# Patient Record
Sex: Female | Born: 1950
Health system: Southern US, Community
[De-identification: ages and names within clinical notes are randomized; demographics above are authoritative.]

## PROBLEM LIST (undated history)

## (undated) DIAGNOSIS — I10 Essential (primary) hypertension: Secondary | ICD-10-CM

## (undated) DIAGNOSIS — G629 Polyneuropathy, unspecified: Secondary | ICD-10-CM

## (undated) DIAGNOSIS — S92353A Displaced fracture of fifth metatarsal bone, unspecified foot, initial encounter for closed fracture: Secondary | ICD-10-CM

## (undated) DIAGNOSIS — F29 Unspecified psychosis not due to a substance or known physiological condition: Secondary | ICD-10-CM

## (undated) DIAGNOSIS — E119 Type 2 diabetes mellitus without complications: Secondary | ICD-10-CM

## (undated) DIAGNOSIS — E785 Hyperlipidemia, unspecified: Secondary | ICD-10-CM

## (undated) DIAGNOSIS — Z789 Other specified health status: Secondary | ICD-10-CM

## (undated) DIAGNOSIS — F209 Schizophrenia, unspecified: Secondary | ICD-10-CM

## (undated) HISTORY — DX: Hyperlipidemia, unspecified: E78.5

## (undated) HISTORY — PX: ABDOMINAL HYSTERECTOMY: SHX81

## (undated) HISTORY — PX: BLADDER SURGERY: SHX569

## (undated) HISTORY — PX: TUBAL LIGATION: SHX77

## (undated) HISTORY — DX: Unspecified psychosis not due to a substance or known physiological condition: F29

## (undated) HISTORY — DX: Essential (primary) hypertension: I10

## (undated) HISTORY — DX: Type 2 diabetes mellitus without complications: E11.9

---

## 2002-06-29 ENCOUNTER — Ambulatory Visit (HOSPITAL_COMMUNITY): Admission: RE | Admit: 2002-06-29 | Discharge: 2002-06-29 | Payer: Self-pay | Admitting: Internal Medicine

## 2002-06-29 ENCOUNTER — Encounter: Payer: Self-pay | Admitting: Internal Medicine

## 2002-07-31 ENCOUNTER — Other Ambulatory Visit: Admission: RE | Admit: 2002-07-31 | Discharge: 2002-07-31 | Payer: Self-pay | Admitting: Obstetrics & Gynecology

## 2004-02-13 ENCOUNTER — Emergency Department (HOSPITAL_COMMUNITY): Admission: EM | Admit: 2004-02-13 | Discharge: 2004-02-13 | Payer: Self-pay | Admitting: *Deleted

## 2005-03-06 ENCOUNTER — Ambulatory Visit (HOSPITAL_COMMUNITY): Admission: RE | Admit: 2005-03-06 | Discharge: 2005-03-06 | Payer: Self-pay | Admitting: Internal Medicine

## 2006-03-11 ENCOUNTER — Ambulatory Visit (HOSPITAL_COMMUNITY): Admission: RE | Admit: 2006-03-11 | Discharge: 2006-03-11 | Payer: Self-pay | Admitting: Internal Medicine

## 2007-03-14 ENCOUNTER — Ambulatory Visit (HOSPITAL_COMMUNITY): Admission: RE | Admit: 2007-03-14 | Discharge: 2007-03-14 | Payer: Self-pay | Admitting: Internal Medicine

## 2007-08-02 ENCOUNTER — Other Ambulatory Visit: Admission: RE | Admit: 2007-08-02 | Discharge: 2007-08-02 | Payer: Self-pay | Admitting: Obstetrics and Gynecology

## 2008-03-15 ENCOUNTER — Ambulatory Visit (HOSPITAL_COMMUNITY): Admission: RE | Admit: 2008-03-15 | Discharge: 2008-03-15 | Payer: Self-pay | Admitting: Internal Medicine

## 2008-04-30 ENCOUNTER — Other Ambulatory Visit: Payer: Self-pay

## 2008-05-01 ENCOUNTER — Ambulatory Visit: Payer: Self-pay | Admitting: Psychiatry

## 2008-05-01 ENCOUNTER — Inpatient Hospital Stay (HOSPITAL_COMMUNITY): Admission: EM | Admit: 2008-05-01 | Discharge: 2008-05-09 | Payer: Self-pay | Admitting: Psychiatry

## 2009-04-01 ENCOUNTER — Ambulatory Visit (HOSPITAL_COMMUNITY): Admission: RE | Admit: 2009-04-01 | Discharge: 2009-04-01 | Payer: Self-pay | Admitting: Internal Medicine

## 2010-05-13 ENCOUNTER — Ambulatory Visit (HOSPITAL_COMMUNITY): Admission: RE | Admit: 2010-05-13 | Discharge: 2010-05-13 | Payer: Self-pay | Admitting: Internal Medicine

## 2010-08-30 ENCOUNTER — Encounter: Payer: Self-pay | Admitting: Internal Medicine

## 2010-12-23 NOTE — H&P (Signed)
Regina Lambert, Regina Lambert NO.:  [id number]   MEDICAL RECORD NO.:  [id number]          PATIENT TYPE:  IPS   LOCATION:  0407                          FACILITY:  [facility]   PHYSICIAN:  Anselm Jungling, MD  DATE OF BIRTH:  24-Oct-1950   DATE OF ADMISSION:  05/01/2008  DATE OF DISCHARGE:                       PSYCHIATRIC ADMISSION ASSESSMENT   DATE OF ASSESSMENT:  May 01, 2008 at 13, 10.   IDENTIFYING INFORMATION:  This is a 60 year old African-American.  This  is a voluntary admission.   HISTORY OF PRESENT ILLNESS:  This patient presented at mental health  referred by her group home manager at [facility] after 2  weeks of gradually  decompensating.  Speech was pressured and rapid.  She was not making a lot of sense, becoming disheveled, speech pressured  and rapid, and she was complaining that people were out to get her and  that her psychiatrist had sent racist people to her home in order to  harm her.  She has voiced to her group home staff on the day of  admission that if she did not get out of there (referring to the group  home), she was not sure what she would do.  Her caregiver reports that  she has a long history of being very stable on her medications and on  Friday, April 27, 2008 had received her Haldol Decanoate dose  without problems.  She has been compliant with medications up until the  past couple of days.  An only known stressor is that her primary  caregiver at the group home died in 01/21/2023 of this year, and the patient  is grieving her death.   PAST PSYCHIATRIC HISTORY:  Followed by Dr. Jenne Campus at [facility] (formerly [facility]).  This is her first  [facility] admission.  She has a history of schizophrenia and has lived for  several years at [facility].  Has been stable on her current  Haldol dose.   SOCIAL HISTORY:  A single African-American female living in a group home  on disability for  mental illness.  She has voiced no suicidal thoughts.  No known history of suicide attempts.  No history of aggressive  behavior.  No known history of substance abuse.   MEDICAL HISTORY:  Primary care physician not clear.   MEDICAL PROBLEMS:  1. Hyponatremia.  2. Diabetes mellitus type 2.   CURRENT MEDICATIONS:  Current medications were validated with the group  home:  1. Xanax 0.25 mg p.o. b.i.d.  2. Haldol Decanoate 50 mg IM q. 3 weeks; last given April 27, 2008.  3. MiraLax 17 grams daily p.r.n. for constipation.  4. Metformin 500 mg b.i.d.   DRUG ALLERGIES:  PENICILLIN.   PHYSICAL EXAMINATION:  The physical exam was done in the emergency room,  along with review of systems, as noted in the records.  GENERAL:  This is a fully alert female who is in no acute distress on  admission to the unit.  VITAL SIGNS:  She is 5 feet 7 inches  tall, 118 pounds (baseline weight  unknown), temperature 97.8, pulse 106, respirations 20, blood pressure  139/91.   DIAGNOSTIC STUDIES:  Diagnostic studies were done in the emergency room.  CBC revealed a WBC of 11.5, hemoglobin 13.5, hematocrit 39.4 and  platelets 326,000.  Her MCV is 97.5.  Chemistry revealed sodium of 127,  potassium 3.5, chloride 94, carbon dioxide 22, BUN 7, creatinine 0.86  and random glucose 134, calcium 9.1.  Alcohol level less than 5.  Urine  drug screen was negative for benzodiazepines, and her routine urinalysis  was negative for nitrites and leukocyte esterase.  Also remarkable for  trace hemoglobin, negative for bilirubin, ketones, glucose and protein,  and no WBCs.   MENTAL STATUS EXAM:  This is a thin, disheveled, but pleasant African-  American female with mildly pressured speech who appears to be quite  disorganized in thinking, looking around, some difficulty attending to  conversation.  Appears to be internally distracted.  Thinking is  tangential.  Insight negligible.  Oriented to person.  Thinks  that she  is actually in [place] when she is asked about her location.  Understands that she is in the hospital, but not sure exactly where and  unclear about time frames.  Pleasant, directable, cooperative with staff  and peers.  Immediate memory is intact, but recent and remote are poor  secondary to internal distractions.   IMPRESSION:  AXIS I:  Psychosis NOS, rule out schizophrenia, rule out  delirium secondary to benzodiazepine withdrawal.  AXIS II:  Deferred.  AXIS III:  Diabetes mellitus type 2, hyponatremia NOS.  AXIS IV:  Rule out grief process secondary to death of a care giver in  02/04/2008.  AXIS V:  Current 34; past year not known.   PLAN:  The plan is to involuntarily admit.  This is an involuntary  admission to improve her reality testing, stabilize her mood.  We are  going to give her 1 mg of Ativan now and will continue her Xanax as  previously ordered and additionally give her 0.25 mg b.i.d. p.r.n. for  anxiety.  She is on Ambien 10 mg h.s. p.r.n. insomnia.  Was also started  on Haldol 5 mg p.o. b.i.d.  Will continue her Haldol Decanoate which is  not due for another 3 weeks, and will continue her other routine  medications and monitor CBGs.      Margaret A. Lorin Picket, N.P.      Anselm Jungling, MD  Electronically Signed    MAS/MEDQ  D:  05/01/2008  T:  05/02/2008  Job:  [id number]

## 2010-12-26 NOTE — Discharge Summary (Signed)
NAMEHANNELORE, BOVA NO.:  [id number]   MEDICAL RECORD NO.:  [id number]          PATIENT TYPE:  IPS   LOCATION:  0407                          FACILITY:  BH   PHYSICIAN:  Anselm Jungling, MD  DATE OF BIRTH:  11-13-1950   DATE OF ADMISSION:  05/01/2008  DATE OF DISCHARGE:  05/09/2008                               DISCHARGE SUMMARY   IDENTIFYING DATA AND REASON FOR ADMISSION:  This is an inpatient  psychiatric admission for Regina Lambert, a 60 year old single African American  female who presented in a state of mental decompensation in association  with her chronic schizophrenia.  Please refer to the admission note for  further details pertaining to the symptoms, circumstances and history  that led to her hospitalization.  She was given an initial Axis I  diagnosis of schizophrenia, chronic paranoid type.   MEDICAL AND LABORATORY:  She was medically and physically assessed by  the psychiatric nurse practitioner.  She came to [place] with a history of  diabetes mellitus.  She appeared thin and under nourished.  She was  continued on Glucophage 500 mg b.i.d.  There were no acute medical  issues.   HOSPITAL COURSE:  The patient was admitted to the inpatient psychiatric  service.  She presented as a thin under nourished woman who initially  was pleasant, but quite disorganized with her tangential thinking.  Her  insight was relatively poor.  However, she was comfortable with being in  our facility for treatment and was cooperative throughout.  She was a  good participant in the milieu.  By the second hospital day, she was  stating that all was well.  She was eating and sleeping well.  She was  agreeable and accepting of the Haldol regimen that had been started.  She was tolerating it well.  She continued on the inpatient service for  approximately 1-week.  Prior to discharge, she was agreeable to  institution of Haldol decanoate 50 mg IM.  In addition, Cogentin 0.5 mg  b.i.d. was given, as well as Xanax 0.5 mg b.i.d.  She agreed to  following aftercare plan.   AFTERCARE:  The patient was to followup at [facility] in  [place] with an appointment on May 11, 2008.   DISCHARGE MEDICATIONS:  1. Glucophage 500 mg b.i.d.  2. Cogentin 0.5 mg b.i.d.  3. Xanax 0.5 mg b.i.d.  4. Haldol 5 mg b.i.d.  5. Haldol decanoate 50 mg IM, next due May 18, 2008.  6. MiraLax 17 mg daily.   DISCHARGE DIAGNOSES:  Axis I:  Schizophrenia, chronic paranoid type,  acute exacerbation, but resolving.  Axis II:  Deferred.  Axis III:  Diabetes mellitus, non-insulin dependent.  Axis IV:  Stressors severe.  Axis V:  Global Assessment of Functioning on discharge 55.      Anselm Jungling, MD  Electronically Signed     SPB/MEDQ  D:  05/17/2008  T:  05/17/2008  Job:  [id number]

## 2011-05-06 ENCOUNTER — Other Ambulatory Visit (HOSPITAL_COMMUNITY): Payer: Self-pay | Admitting: Internal Medicine

## 2011-05-06 DIAGNOSIS — Z139 Encounter for screening, unspecified: Secondary | ICD-10-CM

## 2011-05-11 LAB — URINALYSIS, ROUTINE W REFLEX MICROSCOPIC
Bilirubin Urine: NEGATIVE
Glucose, UA: NEGATIVE
Ketones, ur: NEGATIVE
Specific Gravity, Urine: <1.005 — ABNORMAL LOW
pH: 5.5

## 2011-05-11 LAB — BASIC METABOLIC PANEL
BUN: 7
Chloride: 94 — ABNORMAL LOW
Creatinine, Ser: 0.86
GFR calc Af Amer: >60
GFR calc non Af Amer: >60
Potassium: 3.5

## 2011-05-11 LAB — COMPREHENSIVE METABOLIC PANEL
ALT: 18
AST: 24
Albumin: 3.7
Alkaline Phosphatase: 85
Calcium: 9.7
GFR calc Af Amer: >60
Potassium: 4.3
Sodium: 131 — ABNORMAL LOW
Total Protein: 6.4

## 2011-05-11 LAB — URINE MICROSCOPIC-ADD ON

## 2011-05-11 LAB — DIFFERENTIAL
Eosinophils Absolute: 0
Lymphocytes Relative: 22
Lymphs Abs: 2.5
Monocytes Relative: 8
Neutrophils Relative %: 70

## 2011-05-11 LAB — GLUCOSE, CAPILLARY
Glucose-Capillary: 121 — ABNORMAL HIGH
Glucose-Capillary: 121 — ABNORMAL HIGH
Glucose-Capillary: 141 — ABNORMAL HIGH
Glucose-Capillary: 144 — ABNORMAL HIGH
Glucose-Capillary: 144 — ABNORMAL HIGH
Glucose-Capillary: 149 — ABNORMAL HIGH
Glucose-Capillary: 156 — ABNORMAL HIGH
Glucose-Capillary: 184 — ABNORMAL HIGH
Glucose-Capillary: 224 — ABNORMAL HIGH

## 2011-05-11 LAB — CBC
HCT: 39.4
MCV: 97.5
Platelets: 326
RBC: 4.04
WBC: 11.5 — ABNORMAL HIGH

## 2011-05-11 LAB — RAPID URINE DRUG SCREEN, HOSP PERFORMED
Barbiturates: NOT DETECTED
Cocaine: NOT DETECTED
Opiates: NOT DETECTED

## 2011-05-11 LAB — ETHANOL: Alcohol, Ethyl (B): <5

## 2011-05-18 ENCOUNTER — Ambulatory Visit (HOSPITAL_COMMUNITY)
Admission: RE | Admit: 2011-05-18 | Discharge: 2011-05-18 | Disposition: A | Discharge status: Home / Self Care | Payer: Medicare Other | Source: Ambulatory Visit | Attending: Internal Medicine | Admitting: Internal Medicine

## 2011-05-18 DIAGNOSIS — Z139 Encounter for screening, unspecified: Secondary | ICD-10-CM

## 2011-05-18 DIAGNOSIS — Z1231 Encounter for screening mammogram for malignant neoplasm of breast: Secondary | ICD-10-CM | POA: Insufficient documentation

## 2011-09-23 DIAGNOSIS — F200 Paranoid schizophrenia: Secondary | ICD-10-CM | POA: Diagnosis not present

## 2011-10-13 DIAGNOSIS — E119 Type 2 diabetes mellitus without complications: Secondary | ICD-10-CM | POA: Diagnosis not present

## 2011-10-13 DIAGNOSIS — E1149 Type 2 diabetes mellitus with other diabetic neurological complication: Secondary | ICD-10-CM | POA: Diagnosis not present

## 2011-12-07 DIAGNOSIS — E119 Type 2 diabetes mellitus without complications: Secondary | ICD-10-CM | POA: Diagnosis not present

## 2011-12-22 DIAGNOSIS — E119 Type 2 diabetes mellitus without complications: Secondary | ICD-10-CM | POA: Diagnosis not present

## 2011-12-22 DIAGNOSIS — E1149 Type 2 diabetes mellitus with other diabetic neurological complication: Secondary | ICD-10-CM | POA: Diagnosis not present

## 2012-02-23 DIAGNOSIS — E119 Type 2 diabetes mellitus without complications: Secondary | ICD-10-CM | POA: Diagnosis not present

## 2012-02-23 DIAGNOSIS — E1149 Type 2 diabetes mellitus with other diabetic neurological complication: Secondary | ICD-10-CM | POA: Diagnosis not present

## 2012-03-07 DIAGNOSIS — E119 Type 2 diabetes mellitus without complications: Secondary | ICD-10-CM | POA: Diagnosis not present

## 2012-03-10 DIAGNOSIS — F39 Unspecified mood [affective] disorder: Secondary | ICD-10-CM | POA: Diagnosis not present

## 2012-04-13 ENCOUNTER — Other Ambulatory Visit (HOSPITAL_COMMUNITY): Payer: Self-pay | Admitting: Internal Medicine

## 2012-04-13 DIAGNOSIS — Z139 Encounter for screening, unspecified: Secondary | ICD-10-CM

## 2012-05-03 DIAGNOSIS — E1149 Type 2 diabetes mellitus with other diabetic neurological complication: Secondary | ICD-10-CM | POA: Diagnosis not present

## 2012-05-03 DIAGNOSIS — E119 Type 2 diabetes mellitus without complications: Secondary | ICD-10-CM | POA: Diagnosis not present

## 2012-05-20 ENCOUNTER — Ambulatory Visit (HOSPITAL_COMMUNITY)
Admission: RE | Admit: 2012-05-20 | Discharge: 2012-05-20 | Disposition: A | Discharge status: Home / Self Care | Payer: Medicare Other | Source: Ambulatory Visit | Attending: Internal Medicine | Admitting: Internal Medicine

## 2012-05-20 DIAGNOSIS — Z139 Encounter for screening, unspecified: Secondary | ICD-10-CM

## 2012-05-20 DIAGNOSIS — Z1231 Encounter for screening mammogram for malignant neoplasm of breast: Secondary | ICD-10-CM | POA: Insufficient documentation

## 2012-06-09 DIAGNOSIS — E119 Type 2 diabetes mellitus without complications: Secondary | ICD-10-CM | POA: Diagnosis not present

## 2012-06-09 DIAGNOSIS — Z23 Encounter for immunization: Secondary | ICD-10-CM | POA: Diagnosis not present

## 2012-07-12 DIAGNOSIS — E1149 Type 2 diabetes mellitus with other diabetic neurological complication: Secondary | ICD-10-CM | POA: Diagnosis not present

## 2012-07-12 DIAGNOSIS — E119 Type 2 diabetes mellitus without complications: Secondary | ICD-10-CM | POA: Diagnosis not present

## 2012-08-25 DIAGNOSIS — F39 Unspecified mood [affective] disorder: Secondary | ICD-10-CM | POA: Diagnosis not present

## 2012-09-08 DIAGNOSIS — F29 Unspecified psychosis not due to a substance or known physiological condition: Secondary | ICD-10-CM | POA: Diagnosis not present

## 2012-09-08 DIAGNOSIS — Z0000 Encounter for general adult medical examination without abnormal findings: Secondary | ICD-10-CM | POA: Diagnosis not present

## 2012-09-08 DIAGNOSIS — E119 Type 2 diabetes mellitus without complications: Secondary | ICD-10-CM | POA: Diagnosis not present

## 2012-09-20 DIAGNOSIS — E119 Type 2 diabetes mellitus without complications: Secondary | ICD-10-CM | POA: Diagnosis not present

## 2012-09-20 DIAGNOSIS — E1149 Type 2 diabetes mellitus with other diabetic neurological complication: Secondary | ICD-10-CM | POA: Diagnosis not present

## 2012-12-05 DIAGNOSIS — E119 Type 2 diabetes mellitus without complications: Secondary | ICD-10-CM | POA: Diagnosis not present

## 2012-12-05 DIAGNOSIS — J410 Simple chronic bronchitis: Secondary | ICD-10-CM | POA: Diagnosis not present

## 2012-12-08 DIAGNOSIS — F39 Unspecified mood [affective] disorder: Secondary | ICD-10-CM | POA: Diagnosis not present

## 2012-12-16 DIAGNOSIS — E119 Type 2 diabetes mellitus without complications: Secondary | ICD-10-CM | POA: Diagnosis not present

## 2012-12-16 DIAGNOSIS — E1149 Type 2 diabetes mellitus with other diabetic neurological complication: Secondary | ICD-10-CM | POA: Diagnosis not present

## 2013-02-28 DIAGNOSIS — I10 Essential (primary) hypertension: Secondary | ICD-10-CM | POA: Diagnosis not present

## 2013-02-28 DIAGNOSIS — E119 Type 2 diabetes mellitus without complications: Secondary | ICD-10-CM | POA: Diagnosis not present

## 2013-03-14 DIAGNOSIS — E119 Type 2 diabetes mellitus without complications: Secondary | ICD-10-CM | POA: Diagnosis not present

## 2013-03-14 DIAGNOSIS — E1149 Type 2 diabetes mellitus with other diabetic neurological complication: Secondary | ICD-10-CM | POA: Diagnosis not present

## 2013-05-18 DIAGNOSIS — E1149 Type 2 diabetes mellitus with other diabetic neurological complication: Secondary | ICD-10-CM | POA: Diagnosis not present

## 2013-05-22 ENCOUNTER — Other Ambulatory Visit (HOSPITAL_COMMUNITY): Payer: Self-pay | Admitting: Internal Medicine

## 2013-05-22 DIAGNOSIS — Z139 Encounter for screening, unspecified: Secondary | ICD-10-CM

## 2013-05-25 DIAGNOSIS — F209 Schizophrenia, unspecified: Secondary | ICD-10-CM | POA: Diagnosis not present

## 2013-05-30 DIAGNOSIS — Z23 Encounter for immunization: Secondary | ICD-10-CM | POA: Diagnosis not present

## 2013-05-30 DIAGNOSIS — E119 Type 2 diabetes mellitus without complications: Secondary | ICD-10-CM | POA: Diagnosis not present

## 2013-05-30 DIAGNOSIS — I10 Essential (primary) hypertension: Secondary | ICD-10-CM | POA: Diagnosis not present

## 2013-05-30 DIAGNOSIS — R634 Abnormal weight loss: Secondary | ICD-10-CM | POA: Diagnosis not present

## 2013-05-30 DIAGNOSIS — F2089 Other schizophrenia: Secondary | ICD-10-CM | POA: Diagnosis not present

## 2013-06-02 ENCOUNTER — Ambulatory Visit (HOSPITAL_COMMUNITY)
Admission: RE | Admit: 2013-06-02 | Discharge: 2013-06-02 | Disposition: A | Discharge status: Home / Self Care | Payer: Medicare Other | Source: Ambulatory Visit | Attending: Internal Medicine | Admitting: Internal Medicine

## 2013-06-02 DIAGNOSIS — Z1231 Encounter for screening mammogram for malignant neoplasm of breast: Secondary | ICD-10-CM | POA: Diagnosis not present

## 2013-06-02 DIAGNOSIS — Z139 Encounter for screening, unspecified: Secondary | ICD-10-CM

## 2013-07-21 DIAGNOSIS — B351 Tinea unguium: Secondary | ICD-10-CM | POA: Diagnosis not present

## 2013-07-21 DIAGNOSIS — E1149 Type 2 diabetes mellitus with other diabetic neurological complication: Secondary | ICD-10-CM | POA: Diagnosis not present

## 2013-07-27 DIAGNOSIS — E119 Type 2 diabetes mellitus without complications: Secondary | ICD-10-CM | POA: Diagnosis not present

## 2013-11-02 DIAGNOSIS — F209 Schizophrenia, unspecified: Secondary | ICD-10-CM | POA: Diagnosis not present

## 2013-11-10 DIAGNOSIS — B351 Tinea unguium: Secondary | ICD-10-CM | POA: Diagnosis not present

## 2013-12-15 DIAGNOSIS — E119 Type 2 diabetes mellitus without complications: Secondary | ICD-10-CM | POA: Diagnosis not present

## 2014-01-26 DIAGNOSIS — B351 Tinea unguium: Secondary | ICD-10-CM | POA: Diagnosis not present

## 2014-01-26 DIAGNOSIS — E1149 Type 2 diabetes mellitus with other diabetic neurological complication: Secondary | ICD-10-CM | POA: Diagnosis not present

## 2014-02-15 DIAGNOSIS — F209 Schizophrenia, unspecified: Secondary | ICD-10-CM | POA: Diagnosis not present

## 2014-03-15 DIAGNOSIS — E119 Type 2 diabetes mellitus without complications: Secondary | ICD-10-CM | POA: Diagnosis not present

## 2014-04-06 DIAGNOSIS — E1149 Type 2 diabetes mellitus with other diabetic neurological complication: Secondary | ICD-10-CM | POA: Diagnosis not present

## 2014-04-06 DIAGNOSIS — B351 Tinea unguium: Secondary | ICD-10-CM | POA: Diagnosis not present

## 2014-06-04 DIAGNOSIS — F209 Schizophrenia, unspecified: Secondary | ICD-10-CM | POA: Diagnosis not present

## 2014-06-05 ENCOUNTER — Other Ambulatory Visit (HOSPITAL_COMMUNITY): Payer: Self-pay | Admitting: Internal Medicine

## 2014-06-05 DIAGNOSIS — Z1231 Encounter for screening mammogram for malignant neoplasm of breast: Secondary | ICD-10-CM

## 2014-06-07 ENCOUNTER — Ambulatory Visit (HOSPITAL_COMMUNITY)
Admission: RE | Admit: 2014-06-07 | Discharge: 2014-06-07 | Disposition: A | Discharge status: Home / Self Care | Payer: Medicare Other | Source: Ambulatory Visit | Attending: Internal Medicine | Admitting: Internal Medicine

## 2014-06-07 DIAGNOSIS — Z1231 Encounter for screening mammogram for malignant neoplasm of breast: Secondary | ICD-10-CM | POA: Insufficient documentation

## 2014-06-14 DIAGNOSIS — E119 Type 2 diabetes mellitus without complications: Secondary | ICD-10-CM | POA: Diagnosis not present

## 2014-06-14 DIAGNOSIS — F29 Unspecified psychosis not due to a substance or known physiological condition: Secondary | ICD-10-CM | POA: Diagnosis not present

## 2014-06-14 DIAGNOSIS — Z23 Encounter for immunization: Secondary | ICD-10-CM | POA: Diagnosis not present

## 2014-06-15 DIAGNOSIS — E1142 Type 2 diabetes mellitus with diabetic polyneuropathy: Secondary | ICD-10-CM | POA: Diagnosis not present

## 2014-06-15 DIAGNOSIS — B351 Tinea unguium: Secondary | ICD-10-CM | POA: Diagnosis not present

## 2014-08-17 DIAGNOSIS — E1342 Other specified diabetes mellitus with diabetic polyneuropathy: Secondary | ICD-10-CM | POA: Diagnosis not present

## 2014-09-05 DIAGNOSIS — F29 Unspecified psychosis not due to a substance or known physiological condition: Secondary | ICD-10-CM | POA: Diagnosis not present

## 2014-09-05 DIAGNOSIS — J40 Bronchitis, not specified as acute or chronic: Secondary | ICD-10-CM | POA: Diagnosis not present

## 2014-09-05 DIAGNOSIS — E1165 Type 2 diabetes mellitus with hyperglycemia: Secondary | ICD-10-CM | POA: Diagnosis not present

## 2014-09-07 DIAGNOSIS — B351 Tinea unguium: Secondary | ICD-10-CM | POA: Diagnosis not present

## 2014-09-07 DIAGNOSIS — E1142 Type 2 diabetes mellitus with diabetic polyneuropathy: Secondary | ICD-10-CM | POA: Diagnosis not present

## 2014-09-13 DIAGNOSIS — F209 Schizophrenia, unspecified: Secondary | ICD-10-CM | POA: Diagnosis not present

## 2014-11-16 DIAGNOSIS — B351 Tinea unguium: Secondary | ICD-10-CM | POA: Diagnosis not present

## 2014-11-16 DIAGNOSIS — E1142 Type 2 diabetes mellitus with diabetic polyneuropathy: Secondary | ICD-10-CM | POA: Diagnosis not present

## 2014-11-16 DIAGNOSIS — L851 Acquired keratosis [keratoderma] palmaris et plantaris: Secondary | ICD-10-CM | POA: Diagnosis not present

## 2014-12-06 DIAGNOSIS — Z0000 Encounter for general adult medical examination without abnormal findings: Secondary | ICD-10-CM | POA: Diagnosis not present

## 2014-12-06 DIAGNOSIS — E1165 Type 2 diabetes mellitus with hyperglycemia: Secondary | ICD-10-CM | POA: Diagnosis not present

## 2014-12-06 DIAGNOSIS — F259 Schizoaffective disorder, unspecified: Secondary | ICD-10-CM | POA: Diagnosis not present

## 2014-12-06 DIAGNOSIS — F29 Unspecified psychosis not due to a substance or known physiological condition: Secondary | ICD-10-CM | POA: Diagnosis not present

## 2014-12-06 DIAGNOSIS — J40 Bronchitis, not specified as acute or chronic: Secondary | ICD-10-CM | POA: Diagnosis not present

## 2015-01-16 ENCOUNTER — Telehealth: Payer: Self-pay

## 2015-01-16 NOTE — Telephone Encounter (Signed)
Pt was referred by Dr. Legrand Rams for screening colonoscopy. Triaged ( info from [facility]). She will fax med list and copy of insurance cards here.

## 2015-01-17 DIAGNOSIS — F209 Schizophrenia, unspecified: Secondary | ICD-10-CM | POA: Diagnosis not present

## 2015-02-13 DIAGNOSIS — E1165 Type 2 diabetes mellitus with hyperglycemia: Secondary | ICD-10-CM | POA: Diagnosis not present

## 2015-02-13 DIAGNOSIS — F17200 Nicotine dependence, unspecified, uncomplicated: Secondary | ICD-10-CM | POA: Diagnosis not present

## 2015-02-13 DIAGNOSIS — F29 Unspecified psychosis not due to a substance or known physiological condition: Secondary | ICD-10-CM | POA: Diagnosis not present

## 2015-03-01 DIAGNOSIS — B351 Tinea unguium: Secondary | ICD-10-CM | POA: Diagnosis not present

## 2015-03-01 DIAGNOSIS — L851 Acquired keratosis [keratoderma] palmaris et plantaris: Secondary | ICD-10-CM | POA: Diagnosis not present

## 2015-03-01 DIAGNOSIS — E1142 Type 2 diabetes mellitus with diabetic polyneuropathy: Secondary | ICD-10-CM | POA: Diagnosis not present

## 2015-03-13 ENCOUNTER — Telehealth: Payer: Self-pay

## 2015-03-13 NOTE — Telephone Encounter (Signed)
Pt was referred by Dr. Legrand Rams for screening colonoscopy. I received the med list from [facility].

## 2015-04-03 NOTE — Telephone Encounter (Signed)
LMOM for [facility] that pt needs OV due to med list.

## 2015-04-05 NOTE — Telephone Encounter (Signed)
Mailing a letter for [employer] to call to schedule OV for pt prior to colonoscopy due to meds.

## 2015-05-02 DIAGNOSIS — F209 Schizophrenia, unspecified: Secondary | ICD-10-CM | POA: Diagnosis not present

## 2015-05-14 DIAGNOSIS — L851 Acquired keratosis [keratoderma] palmaris et plantaris: Secondary | ICD-10-CM | POA: Diagnosis not present

## 2015-05-14 DIAGNOSIS — E1142 Type 2 diabetes mellitus with diabetic polyneuropathy: Secondary | ICD-10-CM | POA: Diagnosis not present

## 2015-05-14 DIAGNOSIS — B351 Tinea unguium: Secondary | ICD-10-CM | POA: Diagnosis not present

## 2015-06-09 DIAGNOSIS — Z23 Encounter for immunization: Secondary | ICD-10-CM | POA: Diagnosis not present

## 2015-06-11 ENCOUNTER — Encounter: Payer: Self-pay | Admitting: Internal Medicine

## 2015-07-08 ENCOUNTER — Ambulatory Visit: Payer: Medicare Other | Admitting: Gastroenterology

## 2015-07-23 DIAGNOSIS — E1142 Type 2 diabetes mellitus with diabetic polyneuropathy: Secondary | ICD-10-CM | POA: Diagnosis not present

## 2015-07-23 DIAGNOSIS — B351 Tinea unguium: Secondary | ICD-10-CM | POA: Diagnosis not present

## 2015-07-26 ENCOUNTER — Other Ambulatory Visit: Payer: Self-pay

## 2015-07-26 ENCOUNTER — Ambulatory Visit (INDEPENDENT_AMBULATORY_CARE_PROVIDER_SITE_OTHER): Payer: Medicare Other | Admitting: Gastroenterology

## 2015-07-26 ENCOUNTER — Encounter: Payer: Self-pay | Admitting: Gastroenterology

## 2015-07-26 VITALS — BP 120/82 | HR 111 | Temp 97.9°F | Ht 67.0 in | Wt 139.8 lb

## 2015-07-26 DIAGNOSIS — Z1211 Encounter for screening for malignant neoplasm of colon: Secondary | ICD-10-CM

## 2015-07-26 MED ORDER — PEG 3350-KCL-NA BICARB-NACL 420 G PO SOLR: 4000.0000 mL | Freq: Once | ORAL | Status: DC

## 2015-07-26 MED ORDER — BISACODYL 5 MG PO TBEC: 5.0000 mg | DELAYED_RELEASE_TABLET | Freq: Every day | ORAL | Status: DC | PRN

## 2015-07-26 MED ORDER — PHOSPHATE LAXATIVE 2.7-7.2 GM/15ML PO SOLN: 15.0000 mL | Freq: Once | ORAL | Status: DC

## 2015-07-26 NOTE — Progress Notes (Addendum)
Primary Care Physician:  Rosita Fire, MD Primary Gastroenterologist:  Dr. Gala Romney   Chief Complaint  Patient presents with  . Colonoscopy    HPI:   Regina Lambert is a 64 y.o. female presenting today at the request of Dr. Legrand Rams for an evaluation for initial screening colonoscopy. No abdominal pain. No melena or hematochezia. No constipation or diarrhea. States she is NOT taking Miralax, even though it is on her medication list. Has occasional reflux and drinks water/soda, then burps and feels better. Not too often. Rare. Declining medication for this. No dysphagia. No unexplained weight loss or lack of appetite. Resident at [facility] .     Past Medical History  Diagnosis Date  . Diabetes ([place])   . Hypertension   . Psychotic disorder     schizophrenia    Past Surgical History  Procedure Laterality Date  . Tubal ligation    . Abdominal hysterectomy      Current Outpatient Prescriptions  Medication Sig Dispense Refill  . albuterol (PROVENTIL HFA;VENTOLIN HFA) 108 (90 BASE) MCG/ACT inhaler Inhale 2 puffs into the lungs every 6 (six) hours as needed for wheezing or shortness of breath.    . ALPRAZolam (XANAX) 0.5 MG tablet Take 0.5 mg by mouth daily. In the AM    . aspirin 81 MG tablet Take 81 mg by mouth daily.    . benztropine (COGENTIN) 0.5 MG tablet Take 0.5 mg by mouth 2 (two) times daily.    . haloperidol (HALDOL) 5 MG tablet Take 5 mg by mouth 2 (two) times daily.    . haloperidol lactate (HALDOL) 5 MG/ML injection every 6 (six) hours as needed. Inject 50 mg ( 1/2 cc) intramuscularly every 3 weeks    . metFORMIN (GLUCOPHAGE) 500 MG tablet Take by mouth 2 (two) times daily with a meal.    . polyethylene glycol powder (GLYCOLAX/MIRALAX) powder     . bisacodyl (BISACODYL) 5 MG EC tablet Take 1 tablet (5 mg total) by mouth daily as needed for moderate constipation. 2 tablet 0  . phosphate laxative (FLEET) 2.7-7.2 GM/15ML solution Take 15 mLs by mouth once. 45 mL 0  .  polyethylene glycol-electrolytes (NULYTELY/GOLYTELY) 420 G solution Take 4,000 mLs by mouth once. 4000 mL 0   No current facility-administered medications for this visit.    Allergies as of 07/26/2015  . (Not on File)    Family History  Problem Relation Age of Onset  . Colon cancer Neg Hx     Social History   Social History  . Marital Status: Single    Spouse Name: N/A  . Number of Children: N/A  . Years of Education: N/A   Occupational History  . Not on file.   Social History Main Topics  . Smoking status: Current Some Day Smoker -- 0.50 packs/day    Types: Cigarettes  . Smokeless tobacco: Not on file  . Alcohol Use: No  . Drug Use: No  . Sexual Activity: Not on file   Other Topics Concern  . Not on file   Social History Narrative  . No narrative on file    Review of Systems: Gen: Denies any fever, chills, fatigue, weight loss, lack of appetite.  CV: Denies chest pain, heart palpitations, peripheral edema, syncope.  Resp: occasional SOB  GI: see HPI  GU : Denies urinary burning, urinary frequency, urinary hesitancy MS: Denies joint pain, muscle weakness, cramps, or limitation of movement.  Derm: Denies rash, itching, dry skin Psych: history of psychosis, no  changes from baseline  Heme: Denies bruising, bleeding, and enlarged lymph nodes.  Physical Exam: BP 120/82 mmHg  Pulse 111  Temp(Src) 97.9 F (36.6 C) (Oral)  Ht 5\' 7"  (1.702 m)  Wt 139 lb 12.8 oz (63.413 kg)  BMI 21.89 kg/m2 General:   Alert and oriented. Pleasant and cooperative. Well-nourished and well-developed.  Head:  Normocephalic and atraumatic. Eyes:  Without icterus, sclera clear and conjunctiva pink.  Ears:  Normal auditory acuity. Nose:  No deformity, discharge,  or lesions. Mouth:  No deformity or lesions, oral mucosa pink.  Lungs:  Clear to auscultation bilaterally. No wheezes, rales, or rhonchi. No distress.  Heart:  S1, S2 present without murmurs appreciated.  Abdomen:  +BS,  soft, non-tender and non-distended. No HSM noted. No guarding or rebound. No masses appreciated.  Rectal:  Deferred  Msk:  Symmetrical without gross deformities. Normal posture. Extremities:  Without clubbing or edema. Neurologic:  Alert and  oriented x4;  grossly normal neurologically. Skin:  Intact without significant lesions or rashes. Psych:  Alert and cooperative. Normal mood and affect.

## 2015-07-26 NOTE — Patient Instructions (Signed)
We have scheduled you for a colonoscopy in the near future.  Further recommendations to follow!

## 2015-07-28 ENCOUNTER — Telehealth: Payer: Self-pay | Admitting: Gastroenterology

## 2015-07-28 NOTE — Telephone Encounter (Signed)
Can we find out if she has any allergies? I just need to add that into her office note. Thanks!

## 2015-07-28 NOTE — Assessment & Plan Note (Signed)
64 year old female with need for initial screening colonoscopy, without any concerning lower or upper GI symptoms.  Proceed with TCS with Dr. Gala Romney in near future: the risks, benefits, and alternatives have been discussed with the patient in detail. The patient states understanding and desires to proceed. PROPOFOL due to polypharmacy

## 2015-07-29 NOTE — Progress Notes (Signed)
cc'ed to pcp °

## 2015-07-29 NOTE — Telephone Encounter (Signed)
I spoke with Regina Lambert and she said the pt did not have any allergies.

## 2015-08-09 NOTE — Patient Instructions (Signed)
LUNABELLA DISALVATORE  08/09/2015     @PREFPERIOPPHARMACY @   Your procedure is scheduled on  08/19/2015  Report to [facility] at  69 A.M.  Call this number if you have problems the morning of surgery:  [phone]   Remember:  Do not eat food or drink liquids after midnight.  Take these medicines the morning of surgery with A SIP OF WATER  Xanax, cogentin, haldol. Take your albuterol inhaler before you come.   Do not wear jewelry, make-up or nail polish.  Do not wear lotions, powders, or perfumes.  You may wear deodorant.  Do not shave 48 hours prior to surgery.  Men may shave face and neck.  Do not bring valuables to the hospital.  [facility] is not responsible for any belongings or valuables.  Contacts, dentures or bridgework may not be worn into surgery.  Leave your suitcase in the car.  After surgery it may be brought to your room.  For patients admitted to the hospital, discharge time will be determined by your treatment team.  Patients discharged the day of surgery will not be allowed to drive home.   Name and phone number of your driver:   family Special instructions:  Follow the diet and prep instructions given to you by Dr Roseanne Kaufman office.  Please read over the following fact sheets that you were given. Pain Booklet, Coughing and Deep Breathing, Surgical Site Infection Prevention, Anesthesia Post-op Instructions and Care and Recovery After Surgery      Colonoscopy A colonoscopy is an exam to look at the entire large intestine (colon). This exam can help find problems such as tumors, polyps, inflammation, and areas of bleeding. The exam takes about 1 hour.  LET [facility] CARE PROVIDER KNOW ABOUT:   Any allergies you have.  All medicines you are taking, including vitamins, herbs, eye drops, creams, and over-the-counter medicines.  Previous problems you or members of your family have had with the use of anesthetics.  Any blood disorders you  have.  Previous surgeries you have had.  Medical conditions you have. RISKS AND COMPLICATIONS  Generally, this is a safe procedure. However, as with any procedure, complications can occur. Possible complications include:  Bleeding.  Tearing or rupture of the colon wall.  Reaction to medicines given during the exam.  Infection (rare). BEFORE THE PROCEDURE   Ask your health care provider about changing or stopping your regular medicines.  You may be prescribed an oral bowel prep. This involves drinking a large amount of medicated liquid, starting the day before your procedure. The liquid will cause you to have multiple loose stools until your stool is almost clear or light green. This cleans out your colon in preparation for the procedure.  Do not eat or drink anything else once you have started the bowel prep, unless your health care provider tells you it is safe to do so.  Arrange for someone to drive you home after the procedure. PROCEDURE   You will be given medicine to help you relax (sedative).  You will lie on your side with your knees bent.  A long, flexible tube with a light and camera on the end (colonoscope) will be inserted through the rectum and into the colon. The camera sends video back to a computer screen as it moves through the colon. The colonoscope also releases carbon dioxide gas to inflate the colon. This helps your health care provider see the area better.  During  the exam, your health care provider may take a small tissue sample (biopsy) to be examined under a microscope if any abnormalities are found.  The exam is finished when the entire colon has been viewed. AFTER THE PROCEDURE   Do not drive for 24 hours after the exam.  You may have a small amount of blood in your stool.  You may pass moderate amounts of gas and have mild abdominal cramping or bloating. This is caused by the gas used to inflate your colon during the exam.  Ask when your test  results will be ready and how you will get your results. Make sure you get your test results.   This information is not intended to replace advice given to you by your health care provider. Make sure you discuss any questions you have with your health care provider.   Document Released: 07/24/2000 Document Revised: 05/17/2013 Document Reviewed: 04/03/2013 Elsevier Interactive Patient Education 2016 [employer] Inc[employer] PATIENT INSTRUCTIONS POST-ANESTHESIA  IMMEDIATELY FOLLOWING SURGERY:  Do not drive or operate machinery for the first twenty four hours after surgery.  Do not make any important decisions for twenty four hours after surgery or while taking narcotic pain medications or sedatives.  If you develop intractable nausea and vomiting or a severe headache please notify your doctor immediately.  FOLLOW-UP:  Please make an appointment with your surgeon as instructed. You do not need to follow up with anesthesia unless specifically instructed to do so.  WOUND CARE INSTRUCTIONS (if applicable):  Keep a dry clean dressing on the anesthesia/puncture wound site if there is drainage.  Once the wound has quit draining you may leave it open to air.  Generally you should leave the bandage intact for twenty four hours unless there is drainage.  If the epidural site drains for more than 36-48 hours please call the anesthesia department.  QUESTIONS?:  Please feel free to call your physician or the hospital operator if you have any questions, and they will be happy to assist you.

## 2015-08-15 ENCOUNTER — Encounter (HOSPITAL_COMMUNITY)
Admission: RE | Admit: 2015-08-15 | Discharge: 2015-08-15 | Disposition: A | Discharge status: Home / Self Care | Payer: Medicare Other | Source: Ambulatory Visit | Attending: Internal Medicine | Admitting: Internal Medicine

## 2015-08-15 ENCOUNTER — Encounter (HOSPITAL_COMMUNITY): Payer: Self-pay

## 2015-08-15 ENCOUNTER — Other Ambulatory Visit: Payer: Self-pay

## 2015-08-15 DIAGNOSIS — Z01818 Encounter for other preprocedural examination: Secondary | ICD-10-CM | POA: Insufficient documentation

## 2015-08-15 HISTORY — DX: Other specified health status: Z78.9

## 2015-08-15 HISTORY — DX: Polyneuropathy, unspecified: G62.9

## 2015-08-15 LAB — BASIC METABOLIC PANEL
Anion gap: 8 (ref 5–15)
BUN: 10 mg/dL (ref 6–20)
CHLORIDE: 97 mmol/L — AB (ref 101–111)
CO2: 25 mmol/L (ref 22–32)
CREATININE: 0.75 mg/dL (ref 0.44–1.00)
Calcium: 9.6 mg/dL (ref 8.9–10.3)
Glucose, Bld: 105 mg/dL — ABNORMAL HIGH (ref 65–99)
Potassium: 3.9 mmol/L (ref 3.5–5.1)
SODIUM: 130 mmol/L — AB (ref 135–145)

## 2015-08-15 LAB — CBC WITH DIFFERENTIAL/PLATELET
BASOS ABS: 0 10*3/uL (ref 0.0–0.1)
BASOS PCT: 0 %
EOS ABS: 0.1 10*3/uL (ref 0.0–0.7)
EOS PCT: 1 %
HCT: 37.9 % (ref 36.0–46.0)
HEMOGLOBIN: 13 g/dL (ref 12.0–15.0)
LYMPHS ABS: 1.9 10*3/uL (ref 0.7–4.0)
Lymphocytes Relative: 21 %
MCH: 33.2 pg (ref 26.0–34.0)
MCHC: 34.3 g/dL (ref 30.0–36.0)
MCV: 96.7 fL (ref 78.0–100.0)
Monocytes Absolute: 0.8 10*3/uL (ref 0.1–1.0)
Monocytes Relative: 9 %
NEUTROS PCT: 69 %
Neutro Abs: 6.2 10*3/uL (ref 1.7–7.7)
PLATELETS: 305 10*3/uL (ref 150–400)
RBC: 3.92 MIL/uL (ref 3.87–5.11)
RDW: 13.3 % (ref 11.5–15.5)
WBC: 9 10*3/uL (ref 4.0–10.5)

## 2015-08-19 ENCOUNTER — Encounter (HOSPITAL_COMMUNITY): Admission: RE | Payer: Self-pay | Source: Ambulatory Visit

## 2015-08-19 ENCOUNTER — Ambulatory Visit (HOSPITAL_COMMUNITY): Admission: RE | Admit: 2015-08-19 | Payer: Medicare Other | Source: Ambulatory Visit | Admitting: Internal Medicine

## 2015-08-19 SURGERY — COLONOSCOPY WITH PROPOFOL
Anesthesia: Monitor Anesthesia Care

## 2015-08-20 ENCOUNTER — Telehealth: Payer: Self-pay

## 2015-08-20 NOTE — Telephone Encounter (Signed)
Called pt and was unable to leave a message. Mailing appt card to pt in regards to OV to reschedule TCS

## 2015-09-05 DIAGNOSIS — F209 Schizophrenia, unspecified: Secondary | ICD-10-CM | POA: Diagnosis not present

## 2015-09-11 ENCOUNTER — Other Ambulatory Visit: Payer: Self-pay

## 2015-09-11 ENCOUNTER — Ambulatory Visit (INDEPENDENT_AMBULATORY_CARE_PROVIDER_SITE_OTHER): Payer: Medicare Other | Admitting: Gastroenterology

## 2015-09-11 ENCOUNTER — Encounter: Payer: Self-pay | Admitting: Gastroenterology

## 2015-09-11 VITALS — BP 136/88 | HR 110 | Temp 97.0°F | Ht 67.0 in | Wt 142.2 lb

## 2015-09-11 DIAGNOSIS — Z1211 Encounter for screening for malignant neoplasm of colon: Secondary | ICD-10-CM | POA: Diagnosis not present

## 2015-09-11 MED ORDER — PEG 3350-KCL-NA BICARB-NACL 420 G PO SOLR: 4000.0000 mL | ORAL | Status: DC

## 2015-09-11 MED ORDER — FLEET ENEMA 7-19 GM/118ML RE ENEM: 1.0000 | ENEMA | Freq: Once | RECTAL | Status: DC

## 2015-09-11 MED ORDER — BISACODYL 5 MG PO TBEC: 10.0000 mg | DELAYED_RELEASE_TABLET | Freq: Once | ORAL | Status: DC

## 2015-09-11 NOTE — Assessment & Plan Note (Signed)
65 year old female with need for initial screening colonoscopy, void of any concerning lower or upper GI symptoms.   Proceed with TCS with Dr. Gala Romney in near future: the risks, benefits, and alternatives have been discussed with the patient in detail. The patient states understanding and desires to proceed. PROPOFOL due to polypharmacy and psychiatric history

## 2015-09-11 NOTE — Progress Notes (Signed)
Referring Provider: Rosita Fire, MD Primary Care Physician:  Rosita Fire, MD  Primary GI: Dr. Gala Romney   Chief Complaint  Patient presents with  . set up TCS    HPI:   Regina Lambert is a 65 y.o. female presenting today to reschedule initial screening colonoscopy. She resides at [facility] . She had been scheduled for a colonoscopy Dec 2016 but for some reason did not complete. She will need Propofol due to polypharmacy.   No constipation, diarrhea. Has a BM every day. No rectal bleeding. No upper GI symptoms. No dysphagia. Good appetite.   Past Medical History  Diagnosis Date  . Diabetes ([place])   . Hypertension   . Psychotic disorder     schizophrenia  . Polyneuropathy ([place])   . Poor historian     Past Surgical History  Procedure Laterality Date  . Tubal ligation    . Abdominal hysterectomy      Current Outpatient Prescriptions  Medication Sig Dispense Refill  . albuterol (PROVENTIL HFA;VENTOLIN HFA) 108 (90 BASE) MCG/ACT inhaler Inhale 2 puffs into the lungs every 6 (six) hours as needed for wheezing or shortness of breath.    . ALPRAZolam (XANAX) 0.5 MG tablet Take 0.5 mg by mouth daily. In the AM    . aspirin 81 MG tablet Take 81 mg by mouth daily.    . benztropine (COGENTIN) 0.5 MG tablet Take 0.5 mg by mouth 2 (two) times daily.    . bisacodyl (BISACODYL) 5 MG EC tablet Take 1 tablet (5 mg total) by mouth daily as needed for moderate constipation. 2 tablet 0  . haloperidol (HALDOL) 5 MG tablet Take 5 mg by mouth 2 (two) times daily.    . haloperidol lactate (HALDOL) 5 MG/ML injection every 6 (six) hours as needed. Inject 50 mg ( 1/2 cc) intramuscularly every 3 weeks    . metFORMIN (GLUCOPHAGE) 500 MG tablet Take by mouth 2 (two) times daily with a meal.    . polyethylene glycol powder (GLYCOLAX/MIRALAX) powder      No current facility-administered medications for this visit.    Allergies as of 09/11/2015 - Review Complete 09/11/2015  Allergen Reaction Noted    . Penicillins Swelling 08/15/2015    Family History  Problem Relation Age of Onset  . Colon cancer Neg Hx     Social History   Social History  . Marital Status: Widowed    Spouse Name: N/A  . Number of Children: N/A  . Years of Education: N/A   Social History Main Topics  . Smoking status: Current Some Day Smoker -- 0.50 packs/day    Types: Cigarettes  . Smokeless tobacco: None  . Alcohol Use: No  . Drug Use: No  . Sexual Activity: Not Asked   Other Topics Concern  . None   Social History Narrative    Review of Systems: Gen: Denies fever, chills, anorexia. Denies fatigue, weakness, weight loss.  CV: Denies chest pain, palpitations, syncope, peripheral edema, and claudication. Resp: Denies dyspnea at rest, cough, wheezing, coughing up blood, and pleurisy. GI: see HPI  Derm: Denies rash, itching, dry skin Psych: Denies depression, anxiety, memory loss, confusion. No homicidal or suicidal ideation.  Heme: Denies bruising, bleeding, and enlarged lymph nodes.  Physical Exam: BP 136/88 mmHg  Pulse 110  Temp(Src) 97 F (36.1 C)  Ht 5\' 7"  (1.702 m)  Wt 142 lb 3.2 oz (64.501 kg)  BMI 22.27 kg/m2 General:   Alert and oriented. No distress noted. Pleasant and cooperative.  Head:  Normocephalic and atraumatic. Eyes:  Conjuctiva clear without scleral icterus. Mouth:  Oral mucosa pink and moist. Good dentition. No lesions. Heart:  S1, S2 present without murmurs, rubs, or gallops. Abdomen:  +BS, soft, non-tender and non-distended. No rebound or guarding. No HSM or masses noted. Msk:  Symmetrical without gross deformities. Normal posture. Extremities:  Without edema. Neurologic:  Alert and  oriented x4;  grossly normal neurologically. Skin:  Intact without significant lesions or rashes. Psych:  Alert and cooperative. Normal mood and affect.

## 2015-09-11 NOTE — Progress Notes (Signed)
cc'ed to pcp °

## 2015-09-11 NOTE — Patient Instructions (Signed)
We have scheduled you for a screening colonoscopy with Dr. Gala Romney.  Further recommendations to follow!

## 2015-10-01 DIAGNOSIS — B351 Tinea unguium: Secondary | ICD-10-CM | POA: Diagnosis not present

## 2015-10-01 DIAGNOSIS — E1142 Type 2 diabetes mellitus with diabetic polyneuropathy: Secondary | ICD-10-CM | POA: Diagnosis not present

## 2015-10-02 ENCOUNTER — Encounter (HOSPITAL_COMMUNITY)
Admission: RE | Admit: 2015-10-02 | Discharge: 2015-10-02 | Disposition: A | Discharge status: Home / Self Care | Payer: Medicare Other | Source: Ambulatory Visit | Attending: Internal Medicine | Admitting: Internal Medicine

## 2015-10-02 ENCOUNTER — Encounter (HOSPITAL_COMMUNITY): Payer: Self-pay

## 2015-10-07 ENCOUNTER — Ambulatory Visit (HOSPITAL_COMMUNITY)
Admission: RE | Admit: 2015-10-07 | Discharge: 2015-10-07 | Disposition: A | Discharge status: Home / Self Care | Payer: Medicare Other | Source: Ambulatory Visit | Attending: Internal Medicine | Admitting: Internal Medicine

## 2015-10-07 ENCOUNTER — Encounter (HOSPITAL_COMMUNITY)
Admission: RE | Disposition: A | Discharge status: Home / Self Care | Payer: Self-pay | Source: Ambulatory Visit | Attending: Internal Medicine

## 2015-10-07 ENCOUNTER — Encounter (HOSPITAL_COMMUNITY): Payer: Self-pay | Admitting: *Deleted

## 2015-10-07 ENCOUNTER — Ambulatory Visit (HOSPITAL_COMMUNITY): Payer: Medicare Other | Admitting: Anesthesiology

## 2015-10-07 DIAGNOSIS — E1142 Type 2 diabetes mellitus with diabetic polyneuropathy: Secondary | ICD-10-CM | POA: Diagnosis not present

## 2015-10-07 DIAGNOSIS — F17210 Nicotine dependence, cigarettes, uncomplicated: Secondary | ICD-10-CM | POA: Insufficient documentation

## 2015-10-07 DIAGNOSIS — Z7984 Long term (current) use of oral hypoglycemic drugs: Secondary | ICD-10-CM | POA: Diagnosis not present

## 2015-10-07 DIAGNOSIS — D123 Benign neoplasm of transverse colon: Secondary | ICD-10-CM | POA: Diagnosis not present

## 2015-10-07 DIAGNOSIS — Z86010 Personal history of colonic polyps: Secondary | ICD-10-CM | POA: Insufficient documentation

## 2015-10-07 DIAGNOSIS — Z7982 Long term (current) use of aspirin: Secondary | ICD-10-CM | POA: Insufficient documentation

## 2015-10-07 DIAGNOSIS — K5730 Diverticulosis of large intestine without perforation or abscess without bleeding: Secondary | ICD-10-CM | POA: Diagnosis not present

## 2015-10-07 DIAGNOSIS — F209 Schizophrenia, unspecified: Secondary | ICD-10-CM | POA: Diagnosis not present

## 2015-10-07 DIAGNOSIS — Z1211 Encounter for screening for malignant neoplasm of colon: Secondary | ICD-10-CM | POA: Diagnosis not present

## 2015-10-07 DIAGNOSIS — I10 Essential (primary) hypertension: Secondary | ICD-10-CM | POA: Insufficient documentation

## 2015-10-07 DIAGNOSIS — Z79899 Other long term (current) drug therapy: Secondary | ICD-10-CM | POA: Diagnosis not present

## 2015-10-07 HISTORY — PX: COLONOSCOPY WITH PROPOFOL: SHX5780

## 2015-10-07 HISTORY — PX: POLYPECTOMY: SHX5525

## 2015-10-07 LAB — GLUCOSE, CAPILLARY
GLUCOSE-CAPILLARY: 70 mg/dL (ref 65–99)
Glucose-Capillary: 88 mg/dL (ref 65–99)
Glucose-Capillary: 105 mg/dL — ABNORMAL HIGH (ref 65–99)

## 2015-10-07 SURGERY — COLONOSCOPY WITH PROPOFOL
Anesthesia: Monitor Anesthesia Care

## 2015-10-07 MED ORDER — SODIUM CHLORIDE 0.9 % IV SOLN: INTRAVENOUS | Status: DC

## 2015-10-07 MED ORDER — DEXTROSE 50 % IV SOLN
INTRAVENOUS | Status: DC | PRN
  Administered 2015-10-07: 12.5 g via INTRAVENOUS

## 2015-10-07 MED ORDER — FENTANYL CITRATE (PF) 100 MCG/2ML IJ SOLN
25.0000 ug | INTRAMUSCULAR | Status: AC
  Administered 2015-10-07 (×2): 25 ug via INTRAVENOUS

## 2015-10-07 MED ORDER — ONDANSETRON HCL 4 MG/2ML IJ SOLN
4.0000 mg | Freq: Once | INTRAMUSCULAR | Status: AC
  Administered 2015-10-07: 4 mg via INTRAVENOUS

## 2015-10-07 MED ORDER — ONDANSETRON HCL 4 MG/2ML IJ SOLN
INTRAMUSCULAR | Status: AC
  Filled 2015-10-07: qty 2

## 2015-10-07 MED ORDER — LIDOCAINE HCL (PF) 1 % IJ SOLN
INTRAMUSCULAR | Status: AC
  Filled 2015-10-07: qty 5

## 2015-10-07 MED ORDER — MIDAZOLAM HCL 2 MG/2ML IJ SOLN
1.0000 mg | INTRAMUSCULAR | Status: DC | PRN
  Administered 2015-10-07 (×2): 2 mg via INTRAVENOUS
  Filled 2015-10-07: qty 2

## 2015-10-07 MED ORDER — MIDAZOLAM HCL 2 MG/2ML IJ SOLN
INTRAMUSCULAR | Status: AC
  Filled 2015-10-07: qty 2

## 2015-10-07 MED ORDER — PROPOFOL 500 MG/50ML IV EMUL
INTRAVENOUS | Status: DC | PRN
  Administered 2015-10-07: 150 ug/kg/min via INTRAVENOUS
  Administered 2015-10-07: 12:00:00 via INTRAVENOUS

## 2015-10-07 MED ORDER — MIDAZOLAM HCL 2 MG/2ML IJ SOLN
INTRAMUSCULAR | Status: AC
Start: 2015-10-07 — End: 2015-10-07
  Filled 2015-10-07: qty 2

## 2015-10-07 MED ORDER — FENTANYL CITRATE (PF) 100 MCG/2ML IJ SOLN
INTRAMUSCULAR | Status: AC
  Filled 2015-10-07: qty 2

## 2015-10-07 MED ORDER — ONDANSETRON HCL 4 MG/2ML IJ SOLN: 4.0000 mg | Freq: Once | INTRAMUSCULAR | Status: DC | PRN

## 2015-10-07 MED ORDER — LIDOCAINE HCL (CARDIAC) 10 MG/ML IV SOLN
INTRAVENOUS | Status: DC | PRN
  Administered 2015-10-07: 50 mg via INTRAVENOUS

## 2015-10-07 MED ORDER — DEXTROSE 50 % IV SOLN
INTRAVENOUS | Status: AC
  Filled 2015-10-07: qty 50

## 2015-10-07 MED ORDER — MIDAZOLAM HCL 5 MG/5ML IJ SOLN
INTRAMUSCULAR | Status: DC | PRN
  Administered 2015-10-07: 2 mg via INTRAVENOUS

## 2015-10-07 MED ORDER — LACTATED RINGERS IV SOLN
INTRAVENOUS | Status: DC
  Administered 2015-10-07: 10:00:00 via INTRAVENOUS

## 2015-10-07 MED ORDER — FENTANYL CITRATE (PF) 100 MCG/2ML IJ SOLN: 25.0000 ug | INTRAMUSCULAR | Status: DC | PRN

## 2015-10-07 MED ORDER — PROPOFOL 10 MG/ML IV BOLUS
INTRAVENOUS | Status: AC
  Filled 2015-10-07: qty 20

## 2015-10-07 NOTE — Anesthesia Preprocedure Evaluation (Addendum)
Anesthesia Evaluation  Patient identified by MRN, date of birth, ID band Patient awake    Reviewed: Allergy & Precautions, NPO status , Patient's Chart, lab work & pertinent test results  Airway Mallampati: I  TM Distance: >3 FB Neck ROM: Full    Dental  (+) Dental Advisory Given, Edentulous Upper, Edentulous Lower   Pulmonary Current Smoker,    Pulmonary exam normal        Cardiovascular hypertension, negative cardio ROS Normal cardiovascular exam     Neuro/Psych PSYCHIATRIC DISORDERS Schizophrenia negative neurological ROS  negative psych ROS   GI/Hepatic negative GI ROS, Neg liver ROS,   Endo/Other  negative endocrine ROSdiabetes  Renal/GU negative Renal ROS  negative genitourinary   Musculoskeletal negative musculoskeletal ROS (+)   Abdominal   Peds negative pediatric ROS (+)  Hematology negative hematology ROS (+)   Anesthesia Other Findings   Reproductive/Obstetrics negative OB ROS                            Anesthesia Physical Anesthesia Plan  ASA: III  Anesthesia Plan: MAC   Post-op Pain Management:    Induction: Intravenous  Airway Management Planned: Simple Face Mask  Additional Equipment:   Intra-op Plan:   Post-operative Plan:   Informed Consent: I have reviewed the patients History and Physical, chart, labs and discussed the procedure including the risks, benefits and alternatives for the proposed anesthesia with the patient or authorized representative who has indicated his/her understanding and acceptance.   Dental advisory given  Plan Discussed with: CRNA  Anesthesia Plan Comments:         Anesthesia Quick Evaluation

## 2015-10-07 NOTE — OR Nursing (Signed)
Dr. Tobias Alexander notified of patient taking Tradjenta and metformin this am and accu check 88. No orders received.

## 2015-10-07 NOTE — Interval H&P Note (Signed)
History and Physical Interval Note:  10/07/2015 11:10 AM  Regina Lambert  has presented today for surgery, with the diagnosis of SCREENING  The various methods of treatment have been discussed with the patient and family. After consideration of risks, benefits and other options for treatment, the patient has consented to  Procedure(s) with comments: COLONOSCOPY WITH PROPOFOL (N/A) - 1030 - moved to 11:15 - office to notify as a surgical intervention .  The patient's history has been reviewed, patient examined, no change in status, stable for surgery.  I have reviewed the patient's chart and labs.  Questions were answered to the patient's satisfaction.     Durga Saldarriaga  No change. Screening colonoscopy per plan. The risks, benefits, limitations, alternatives and imponderables have been reviewed with the patient. Questions have been answered. All parties are agreeable.

## 2015-10-07 NOTE — H&P (View-Only) (Signed)
Referring Provider: Rosita Fire, MD Primary Care Physician:  Rosita Fire, MD  Primary GI: Dr. Gala Romney   Chief Complaint  Patient presents with  . set up TCS    HPI:   BANNER IRELAN is a 65 y.o. female presenting today to reschedule initial screening colonoscopy. She resides at [facility] . She had been scheduled for a colonoscopy Dec 2016 but for some reason did not complete. She will need Propofol due to polypharmacy.   No constipation, diarrhea. Has a BM every day. No rectal bleeding. No upper GI symptoms. No dysphagia. Good appetite.   Past Medical History  Diagnosis Date  . Diabetes ([place])   . Hypertension   . Psychotic disorder     schizophrenia  . Polyneuropathy ([place])   . Poor historian     Past Surgical History  Procedure Laterality Date  . Tubal ligation    . Abdominal hysterectomy      Current Outpatient Prescriptions  Medication Sig Dispense Refill  . albuterol (PROVENTIL HFA;VENTOLIN HFA) 108 (90 BASE) MCG/ACT inhaler Inhale 2 puffs into the lungs every 6 (six) hours as needed for wheezing or shortness of breath.    . ALPRAZolam (XANAX) 0.5 MG tablet Take 0.5 mg by mouth daily. In the AM    . aspirin 81 MG tablet Take 81 mg by mouth daily.    . benztropine (COGENTIN) 0.5 MG tablet Take 0.5 mg by mouth 2 (two) times daily.    . bisacodyl (BISACODYL) 5 MG EC tablet Take 1 tablet (5 mg total) by mouth daily as needed for moderate constipation. 2 tablet 0  . haloperidol (HALDOL) 5 MG tablet Take 5 mg by mouth 2 (two) times daily.    . haloperidol lactate (HALDOL) 5 MG/ML injection every 6 (six) hours as needed. Inject 50 mg ( 1/2 cc) intramuscularly every 3 weeks    . metFORMIN (GLUCOPHAGE) 500 MG tablet Take by mouth 2 (two) times daily with a meal.    . polyethylene glycol powder (GLYCOLAX/MIRALAX) powder      No current facility-administered medications for this visit.    Allergies as of 09/11/2015 - Review Complete 09/11/2015  Allergen Reaction Noted    . Penicillins Swelling 08/15/2015    Family History  Problem Relation Age of Onset  . Colon cancer Neg Hx     Social History   Social History  . Marital Status: Widowed    Spouse Name: N/A  . Number of Children: N/A  . Years of Education: N/A   Social History Main Topics  . Smoking status: Current Some Day Smoker -- 0.50 packs/day    Types: Cigarettes  . Smokeless tobacco: None  . Alcohol Use: No  . Drug Use: No  . Sexual Activity: Not Asked   Other Topics Concern  . None   Social History Narrative    Review of Systems: Gen: Denies fever, chills, anorexia. Denies fatigue, weakness, weight loss.  CV: Denies chest pain, palpitations, syncope, peripheral edema, and claudication. Resp: Denies dyspnea at rest, cough, wheezing, coughing up blood, and pleurisy. GI: see HPI  Derm: Denies rash, itching, dry skin Psych: Denies depression, anxiety, memory loss, confusion. No homicidal or suicidal ideation.  Heme: Denies bruising, bleeding, and enlarged lymph nodes.  Physical Exam: BP 136/88 mmHg  Pulse 110  Temp(Src) 97 F (36.1 C)  Ht 5\' 7"  (1.702 m)  Wt 142 lb 3.2 oz (64.501 kg)  BMI 22.27 kg/m2 General:   Alert and oriented. No distress noted. Pleasant and cooperative.  Head:  Normocephalic and atraumatic. Eyes:  Conjuctiva clear without scleral icterus. Mouth:  Oral mucosa pink and moist. Good dentition. No lesions. Heart:  S1, S2 present without murmurs, rubs, or gallops. Abdomen:  +BS, soft, non-tender and non-distended. No rebound or guarding. No HSM or masses noted. Msk:  Symmetrical without gross deformities. Normal posture. Extremities:  Without edema. Neurologic:  Alert and  oriented x4;  grossly normal neurologically. Skin:  Intact without significant lesions or rashes. Psych:  Alert and cooperative. Normal mood and affect.

## 2015-10-07 NOTE — Op Note (Signed)
[facility] [address] [place], [address]   COLONOSCOPY PROCEDURE REPORT  PATIENT: Regina Lambert, Regina Lambert  MR#: [id number] BIRTHDATE: 08/17/1950 , 89  yrs. old GENDER: female ENDOSCOPIST: R.  Garfield Cornea, MD FACP [facility] REFERRED [id number] Legrand Rams, M.D. PROCEDURE DATE:  2015-10-19 PROCEDURE:   Colonoscopy with snare polypectomy INDICATIONS:First-ever average risk screening colonoscopy. MEDICATIONS: Deep sedation per Dr.  [facility] ASA CLASS:       Class II  CONSENT: The risks, benefits, alternatives and imponderables including but not limited to bleeding, perforation as well as the possibility of a missed lesion have been reviewed.  The potential for biopsy, lesion removal, etc. have also been discussed. Questions have been answered.  All parties agreeable.  Please see the history and physical in the medical record for more information.  DESCRIPTION OF PROCEDURE:   After the risks benefits and alternatives of the procedure were thoroughly explained, informed consent was obtained.  The digital rectal exam revealed no abnormalities of the rectum.   The [id number]  endoscope was introduced through the anus and advanced to the cecum, which was identified by both the appendix and ileocecal valve. No adverse events experienced.   The quality of the prep was adequate  The instrument was then slowly withdrawn as the colon was fully examined. Estimated blood loss is zero unless otherwise noted in this procedure report.      COLON FINDINGS: Normal-appearing rectal mucosa.  Pancolonic diverticulosis.  (1) 8 mm pedunculated polyp at the hepatic flexure; otherwise, the remainder of the colonic mucosa appeared normal.  The above-mentioned problems hot snare removed and recovered. Retroflexion was performed. .  Withdrawal time=14 minutes 0 seconds.  The scope was withdrawn and the procedure completed. COMPLICATIONS: There were no immediate  complications.  ENDOSCOPIC IMPRESSION: Pancolonic diverticulosis. Colonic polyp?"removed as described above  RECOMMENDATIONS: Follow-up of pathology.  eSigned:  R. Garfield Cornea, MD Rosalita Chessman [facility] Oct 19, 2015 11:48 AM   cc:  CPT CODES: ICD CODES:  The ICD and CPT codes recommended by this software are interpretations from the data that the clinical staff has captured with the software.  The verification of the translation of this report to the ICD and CPT codes and modifiers is the sole responsibility of the health care institution and practicing physician where this report was generated.  [employer] will not be held responsible for the validity of the ICD and CPT codes included on this report.  AMA assumes no liability for data contained or not contained herein. CPT is a Designer, television/film set of the [employer].  PATIENT NAME:  Regina Lambert, Regina Lambert MR#: [id number]

## 2015-10-07 NOTE — Transfer of Care (Signed)
Immediate Anesthesia Transfer of Care Note  Patient: Regina Lambert  Procedure(s) Performed: Procedure(s) with comments: COLONOSCOPY WITH PROPOFOL (N/A) - 1030 - moved to 11:15 - office to notify POLYPECTOMY - hepatic flexsure  Patient Location: PACU  Anesthesia Type:MAC  Level of Consciousness: sedated and patient cooperative  Airway & Oxygen Therapy: Patient Spontanous Breathing and non-rebreather face mask  Post-op Assessment: Report given to RN, Post -op Vital signs reviewed and stable and Patient moving all extremities  Post vital signs: Reviewed and stable    Complications: No apparent anesthesia complications

## 2015-10-07 NOTE — Anesthesia Postprocedure Evaluation (Signed)
Anesthesia Post Note  Patient: Regina Lambert  Procedure(s) Performed: Procedure(s) (LRB): COLONOSCOPY WITH PROPOFOL (N/A) POLYPECTOMY  Patient location during evaluation: PACU Anesthesia Type: MAC Level of consciousness: awake Pain management: satisfactory to patient Vital Signs Assessment: post-procedure vital signs reviewed and stable Respiratory status: spontaneous breathing and patient connected to nasal cannula oxygen Cardiovascular status: stable Anesthetic complications: no    Last Vitals:  Filed Vitals:   10/07/15 1100 10/07/15 1155  BP: 153/79 129/90  Pulse:    Temp:  36.4 C  Resp: 20 20    Last Pain: There were no vitals filed for this visit.               Drucie Opitz

## 2015-10-07 NOTE — Discharge Instructions (Signed)
Diverticulosis Diverticulosis is the condition that develops when small pouches (diverticula) form in the wall of your colon. Your colon, or large intestine, is where water is absorbed and stool is formed. The pouches form when the inside layer of your colon pushes through weak spots in the outer layers of your colon. CAUSES  No one knows exactly what causes diverticulosis. RISK FACTORS  Being older than 47. Your risk for this condition increases with age. Diverticulosis is rare in people younger than 40 years. By age 41, almost everyone has it.  Eating a low-fiber diet.  Being frequently constipated.  Being overweight.  Not getting enough exercise.  Smoking.  Taking over-the-counter pain medicines, like aspirin and ibuprofen. SYMPTOMS  Most people with diverticulosis do not have symptoms. DIAGNOSIS  Because diverticulosis often has no symptoms, health care providers often discover the condition during an exam for other colon problems. In many cases, a health care provider will diagnose diverticulosis while using a flexible scope to examine the colon (colonoscopy). TREATMENT  If you have never developed an infection related to diverticulosis, you may not need treatment. If you have had an infection before, treatment may include:  Eating more fruits, vegetables, and grains.  Taking a fiber supplement.  Taking a live bacteria supplement (probiotic).  Taking medicine to relax your colon. HOME CARE INSTRUCTIONS   Drink at least 6-8 glasses of water each day to prevent constipation.  Try not to strain when you have a bowel movement.  Keep all follow-up appointments. If you have had an infection before:  Increase the fiber in your diet as directed by your health care provider or dietitian.  Take a dietary fiber supplement if your health care provider approves.  Only take medicines as directed by your health care provider. SEEK MEDICAL CARE IF:   You have abdominal  pain.  You have bloating.  You have cramps.  You have not gone to the bathroom in 3 days. SEEK IMMEDIATE MEDICAL CARE IF:   Your pain gets worse.  Yourbloating becomes very bad.  You have a fever or chills, and your symptoms suddenly get worse.  You begin vomiting.  You have bowel movements that are bloody or black. MAKE SURE YOU:  Understand these instructions.  Will watch your condition.  Will get help right away if you are not doing well or get worse.   This information is not intended to replace advice given to you by your health care provider. Make sure you discuss any questions you have with your health care provider.   Document Released: 04/23/2004 Document Revised: 08/01/2013 Document Reviewed: 06/21/2013 Elsevier Interactive Patient Education 2016 [employer]  Colonoscopy Discharge Instructions  Read the instructions outlined below and refer to this sheet in the next few weeks. These discharge instructions provide you with general information on caring for yourself after you leave the hospital. Your doctor may also give you specific instructions. While your treatment has been planned according to the most current medical practices available, unavoidable complications occasionally occur. If you have any problems or questions after discharge, call Dr. Gala Romney at [phone]. ACTIVITY  You may resume your regular activity, but move at a slower pace for the next 24 hours.   Take frequent rest periods for the next 24 hours.   Walking will help get rid of the air and reduce the bloated feeling in your belly (abdomen).   No driving for 24 hours (because of the medicine (anesthesia) used during the test).    Do  not sign any important legal documents or operate any machinery for 24 hours (because of the anesthesia used during the test).  NUTRITION  Drink plenty of fluids.   You may resume your normal diet as instructed by your doctor.   Begin with a light meal and  progress to your normal diet. Heavy or fried foods are harder to digest and may make you feel sick to your stomach (nauseated).   Avoid alcoholic beverages for 24 hours or as instructed.  MEDICATIONS  You may resume your normal medications unless your doctor tells you otherwise.  WHAT YOU CAN EXPECT TODAY  Some feelings of bloating in the abdomen.   Passage of more gas than usual.   Spotting of blood in your stool or on the toilet paper.  IF YOU HAD POLYPS REMOVED DURING THE COLONOSCOPY:  No aspirin products for 7 days or as instructed.   No alcohol for 7 days or as instructed.   Eat a soft diet for the next 24 hours.  FINDING OUT THE RESULTS OF YOUR TEST Not all test results are available during your visit. If your test results are not back during the visit, make an appointment with your caregiver to find out the results. Do not assume everything is normal if you have not heard from your caregiver or the medical facility. It is important for you to follow up on all of your test results.  SEEK IMMEDIATE MEDICAL ATTENTION IF:  You have more than a spotting of blood in your stool.   Your belly is swollen (abdominal distention).   You are nauseated or vomiting.   You have a temperature over 101.   You have abdominal pain or discomfort that is severe or gets worse throughout the day.      Diverticulosis and polyp information provided                                                                                                                                                                                                                   Further recommendations to follow pending review of pathology report

## 2015-10-07 NOTE — Anesthesia Procedure Notes (Signed)
Procedure Name: MAC Date/Time: 10/07/2015 11:13 AM Performed by: Andree Elk, AMY A Pre-anesthesia Checklist: Patient identified, Emergency Drugs available, Suction available, Patient being monitored and Timeout performed Oxygen Delivery Method: Simple face mask

## 2015-10-09 ENCOUNTER — Encounter: Payer: Self-pay | Admitting: Internal Medicine

## 2015-10-09 ENCOUNTER — Encounter (HOSPITAL_COMMUNITY): Payer: Self-pay | Admitting: Internal Medicine

## 2015-12-10 DIAGNOSIS — B351 Tinea unguium: Secondary | ICD-10-CM | POA: Diagnosis not present

## 2015-12-10 DIAGNOSIS — E1142 Type 2 diabetes mellitus with diabetic polyneuropathy: Secondary | ICD-10-CM | POA: Diagnosis not present

## 2015-12-19 DIAGNOSIS — F209 Schizophrenia, unspecified: Secondary | ICD-10-CM | POA: Diagnosis not present

## 2016-02-25 DIAGNOSIS — B351 Tinea unguium: Secondary | ICD-10-CM | POA: Diagnosis not present

## 2016-02-25 DIAGNOSIS — E1142 Type 2 diabetes mellitus with diabetic polyneuropathy: Secondary | ICD-10-CM | POA: Diagnosis not present

## 2016-04-08 DIAGNOSIS — F209 Schizophrenia, unspecified: Secondary | ICD-10-CM | POA: Diagnosis not present

## 2016-04-14 DIAGNOSIS — F29 Unspecified psychosis not due to a substance or known physiological condition: Secondary | ICD-10-CM | POA: Diagnosis not present

## 2016-04-14 DIAGNOSIS — Z23 Encounter for immunization: Secondary | ICD-10-CM | POA: Diagnosis not present

## 2016-04-14 DIAGNOSIS — F259 Schizoaffective disorder, unspecified: Secondary | ICD-10-CM | POA: Diagnosis not present

## 2016-04-14 DIAGNOSIS — E1165 Type 2 diabetes mellitus with hyperglycemia: Secondary | ICD-10-CM | POA: Diagnosis not present

## 2016-04-14 DIAGNOSIS — Z0000 Encounter for general adult medical examination without abnormal findings: Secondary | ICD-10-CM | POA: Diagnosis not present

## 2016-04-14 DIAGNOSIS — J410 Simple chronic bronchitis: Secondary | ICD-10-CM | POA: Diagnosis not present

## 2016-04-14 DIAGNOSIS — F17290 Nicotine dependence, other tobacco product, uncomplicated: Secondary | ICD-10-CM | POA: Diagnosis not present

## 2016-04-15 DIAGNOSIS — F17290 Nicotine dependence, other tobacco product, uncomplicated: Secondary | ICD-10-CM | POA: Diagnosis not present

## 2016-04-15 DIAGNOSIS — F29 Unspecified psychosis not due to a substance or known physiological condition: Secondary | ICD-10-CM | POA: Diagnosis not present

## 2016-04-15 DIAGNOSIS — J410 Simple chronic bronchitis: Secondary | ICD-10-CM | POA: Diagnosis not present

## 2016-04-15 DIAGNOSIS — E1165 Type 2 diabetes mellitus with hyperglycemia: Secondary | ICD-10-CM | POA: Diagnosis not present

## 2016-04-15 DIAGNOSIS — Z23 Encounter for immunization: Secondary | ICD-10-CM | POA: Diagnosis not present

## 2016-05-05 DIAGNOSIS — B351 Tinea unguium: Secondary | ICD-10-CM | POA: Diagnosis not present

## 2016-05-05 DIAGNOSIS — E1142 Type 2 diabetes mellitus with diabetic polyneuropathy: Secondary | ICD-10-CM | POA: Diagnosis not present

## 2016-07-14 DIAGNOSIS — B351 Tinea unguium: Secondary | ICD-10-CM | POA: Diagnosis not present

## 2016-07-14 DIAGNOSIS — E1165 Type 2 diabetes mellitus with hyperglycemia: Secondary | ICD-10-CM | POA: Diagnosis not present

## 2016-07-14 DIAGNOSIS — F17290 Nicotine dependence, other tobacco product, uncomplicated: Secondary | ICD-10-CM | POA: Diagnosis not present

## 2016-07-14 DIAGNOSIS — E1142 Type 2 diabetes mellitus with diabetic polyneuropathy: Secondary | ICD-10-CM | POA: Diagnosis not present

## 2016-07-14 DIAGNOSIS — J209 Acute bronchitis, unspecified: Secondary | ICD-10-CM | POA: Diagnosis not present

## 2016-07-29 DIAGNOSIS — F209 Schizophrenia, unspecified: Secondary | ICD-10-CM | POA: Diagnosis not present

## 2016-09-22 DIAGNOSIS — B351 Tinea unguium: Secondary | ICD-10-CM | POA: Diagnosis not present

## 2016-09-22 DIAGNOSIS — E1142 Type 2 diabetes mellitus with diabetic polyneuropathy: Secondary | ICD-10-CM | POA: Diagnosis not present

## 2016-11-10 DIAGNOSIS — F29 Unspecified psychosis not due to a substance or known physiological condition: Secondary | ICD-10-CM | POA: Diagnosis not present

## 2016-11-10 DIAGNOSIS — E1165 Type 2 diabetes mellitus with hyperglycemia: Secondary | ICD-10-CM | POA: Diagnosis not present

## 2016-11-10 DIAGNOSIS — F17290 Nicotine dependence, other tobacco product, uncomplicated: Secondary | ICD-10-CM | POA: Diagnosis not present

## 2016-11-25 DIAGNOSIS — F209 Schizophrenia, unspecified: Secondary | ICD-10-CM | POA: Diagnosis not present

## 2016-12-01 DIAGNOSIS — B351 Tinea unguium: Secondary | ICD-10-CM | POA: Diagnosis not present

## 2016-12-01 DIAGNOSIS — E1142 Type 2 diabetes mellitus with diabetic polyneuropathy: Secondary | ICD-10-CM | POA: Diagnosis not present

## 2017-02-16 DIAGNOSIS — B351 Tinea unguium: Secondary | ICD-10-CM | POA: Diagnosis not present

## 2017-02-16 DIAGNOSIS — E1142 Type 2 diabetes mellitus with diabetic polyneuropathy: Secondary | ICD-10-CM | POA: Diagnosis not present

## 2017-02-17 DIAGNOSIS — E1165 Type 2 diabetes mellitus with hyperglycemia: Secondary | ICD-10-CM | POA: Diagnosis not present

## 2017-02-17 DIAGNOSIS — M79605 Pain in left leg: Secondary | ICD-10-CM | POA: Diagnosis not present

## 2017-02-17 DIAGNOSIS — F29 Unspecified psychosis not due to a substance or known physiological condition: Secondary | ICD-10-CM | POA: Diagnosis not present

## 2017-03-10 DIAGNOSIS — F209 Schizophrenia, unspecified: Secondary | ICD-10-CM | POA: Diagnosis not present

## 2017-03-18 DIAGNOSIS — B369 Superficial mycosis, unspecified: Secondary | ICD-10-CM | POA: Diagnosis not present

## 2017-03-18 DIAGNOSIS — E1165 Type 2 diabetes mellitus with hyperglycemia: Secondary | ICD-10-CM | POA: Diagnosis not present

## 2017-03-18 DIAGNOSIS — F17290 Nicotine dependence, other tobacco product, uncomplicated: Secondary | ICD-10-CM | POA: Diagnosis not present

## 2017-03-30 ENCOUNTER — Other Ambulatory Visit (HOSPITAL_COMMUNITY): Payer: Self-pay | Admitting: Internal Medicine

## 2017-03-30 DIAGNOSIS — Z1231 Encounter for screening mammogram for malignant neoplasm of breast: Secondary | ICD-10-CM

## 2017-04-02 ENCOUNTER — Ambulatory Visit (HOSPITAL_COMMUNITY): Payer: Medicare Other

## 2017-04-15 ENCOUNTER — Ambulatory Visit (HOSPITAL_COMMUNITY)
Admission: RE | Admit: 2017-04-15 | Discharge: 2017-04-15 | Disposition: A | Discharge status: Home / Self Care | Payer: Medicare Other | Source: Ambulatory Visit | Attending: Internal Medicine | Admitting: Internal Medicine

## 2017-04-15 DIAGNOSIS — Z1231 Encounter for screening mammogram for malignant neoplasm of breast: Secondary | ICD-10-CM | POA: Diagnosis not present

## 2017-05-04 DIAGNOSIS — E1142 Type 2 diabetes mellitus with diabetic polyneuropathy: Secondary | ICD-10-CM | POA: Diagnosis not present

## 2017-05-04 DIAGNOSIS — B351 Tinea unguium: Secondary | ICD-10-CM | POA: Diagnosis not present

## 2017-05-20 DIAGNOSIS — F29 Unspecified psychosis not due to a substance or known physiological condition: Secondary | ICD-10-CM | POA: Diagnosis not present

## 2017-05-20 DIAGNOSIS — J410 Simple chronic bronchitis: Secondary | ICD-10-CM | POA: Diagnosis not present

## 2017-05-20 DIAGNOSIS — Z1389 Encounter for screening for other disorder: Secondary | ICD-10-CM | POA: Diagnosis not present

## 2017-05-20 DIAGNOSIS — F259 Schizoaffective disorder, unspecified: Secondary | ICD-10-CM | POA: Diagnosis not present

## 2017-05-20 DIAGNOSIS — Z23 Encounter for immunization: Secondary | ICD-10-CM | POA: Diagnosis not present

## 2017-05-20 DIAGNOSIS — F17290 Nicotine dependence, other tobacco product, uncomplicated: Secondary | ICD-10-CM | POA: Diagnosis not present

## 2017-05-20 DIAGNOSIS — E1165 Type 2 diabetes mellitus with hyperglycemia: Secondary | ICD-10-CM | POA: Diagnosis not present

## 2017-05-20 DIAGNOSIS — F17210 Nicotine dependence, cigarettes, uncomplicated: Secondary | ICD-10-CM | POA: Diagnosis not present

## 2017-05-20 DIAGNOSIS — Z0000 Encounter for general adult medical examination without abnormal findings: Secondary | ICD-10-CM | POA: Diagnosis not present

## 2017-06-23 DIAGNOSIS — F209 Schizophrenia, unspecified: Secondary | ICD-10-CM | POA: Diagnosis not present

## 2017-07-13 DIAGNOSIS — B351 Tinea unguium: Secondary | ICD-10-CM | POA: Diagnosis not present

## 2017-07-13 DIAGNOSIS — E1142 Type 2 diabetes mellitus with diabetic polyneuropathy: Secondary | ICD-10-CM | POA: Diagnosis not present

## 2017-08-20 DIAGNOSIS — F29 Unspecified psychosis not due to a substance or known physiological condition: Secondary | ICD-10-CM | POA: Diagnosis not present

## 2017-08-20 DIAGNOSIS — E1142 Type 2 diabetes mellitus with diabetic polyneuropathy: Secondary | ICD-10-CM | POA: Diagnosis not present

## 2017-08-20 DIAGNOSIS — E1165 Type 2 diabetes mellitus with hyperglycemia: Secondary | ICD-10-CM | POA: Diagnosis not present

## 2017-08-20 DIAGNOSIS — F17290 Nicotine dependence, other tobacco product, uncomplicated: Secondary | ICD-10-CM | POA: Diagnosis not present

## 2017-08-20 DIAGNOSIS — J410 Simple chronic bronchitis: Secondary | ICD-10-CM | POA: Diagnosis not present

## 2017-09-17 DIAGNOSIS — B351 Tinea unguium: Secondary | ICD-10-CM | POA: Diagnosis not present

## 2017-09-17 DIAGNOSIS — E1142 Type 2 diabetes mellitus with diabetic polyneuropathy: Secondary | ICD-10-CM | POA: Diagnosis not present

## 2017-11-03 DIAGNOSIS — F209 Schizophrenia, unspecified: Secondary | ICD-10-CM | POA: Diagnosis not present

## 2017-11-19 DIAGNOSIS — J410 Simple chronic bronchitis: Secondary | ICD-10-CM | POA: Diagnosis not present

## 2017-11-19 DIAGNOSIS — F29 Unspecified psychosis not due to a substance or known physiological condition: Secondary | ICD-10-CM | POA: Diagnosis not present

## 2017-11-19 DIAGNOSIS — F17290 Nicotine dependence, other tobacco product, uncomplicated: Secondary | ICD-10-CM | POA: Diagnosis not present

## 2017-11-19 DIAGNOSIS — E1165 Type 2 diabetes mellitus with hyperglycemia: Secondary | ICD-10-CM | POA: Diagnosis not present

## 2017-11-26 DIAGNOSIS — E1142 Type 2 diabetes mellitus with diabetic polyneuropathy: Secondary | ICD-10-CM | POA: Diagnosis not present

## 2017-11-26 DIAGNOSIS — B351 Tinea unguium: Secondary | ICD-10-CM | POA: Diagnosis not present

## 2018-02-23 DIAGNOSIS — F29 Unspecified psychosis not due to a substance or known physiological condition: Secondary | ICD-10-CM | POA: Diagnosis not present

## 2018-02-23 DIAGNOSIS — J410 Simple chronic bronchitis: Secondary | ICD-10-CM | POA: Diagnosis not present

## 2018-02-23 DIAGNOSIS — E119 Type 2 diabetes mellitus without complications: Secondary | ICD-10-CM | POA: Diagnosis not present

## 2018-04-19 DIAGNOSIS — E119 Type 2 diabetes mellitus without complications: Secondary | ICD-10-CM | POA: Diagnosis not present

## 2018-04-20 DIAGNOSIS — F209 Schizophrenia, unspecified: Secondary | ICD-10-CM | POA: Diagnosis not present

## 2018-05-11 DIAGNOSIS — F209 Schizophrenia, unspecified: Secondary | ICD-10-CM | POA: Diagnosis not present

## 2018-05-13 ENCOUNTER — Other Ambulatory Visit (HOSPITAL_COMMUNITY): Payer: Self-pay | Admitting: Internal Medicine

## 2018-05-13 DIAGNOSIS — Z1231 Encounter for screening mammogram for malignant neoplasm of breast: Secondary | ICD-10-CM

## 2018-05-26 DIAGNOSIS — J410 Simple chronic bronchitis: Secondary | ICD-10-CM | POA: Diagnosis not present

## 2018-05-26 DIAGNOSIS — Z0000 Encounter for general adult medical examination without abnormal findings: Secondary | ICD-10-CM | POA: Diagnosis not present

## 2018-05-26 DIAGNOSIS — Z1331 Encounter for screening for depression: Secondary | ICD-10-CM | POA: Diagnosis not present

## 2018-05-26 DIAGNOSIS — F259 Schizoaffective disorder, unspecified: Secondary | ICD-10-CM | POA: Diagnosis not present

## 2018-05-26 DIAGNOSIS — E782 Mixed hyperlipidemia: Secondary | ICD-10-CM | POA: Diagnosis not present

## 2018-05-26 DIAGNOSIS — Z1389 Encounter for screening for other disorder: Secondary | ICD-10-CM | POA: Diagnosis not present

## 2018-05-26 DIAGNOSIS — F29 Unspecified psychosis not due to a substance or known physiological condition: Secondary | ICD-10-CM | POA: Diagnosis not present

## 2018-05-26 DIAGNOSIS — F17210 Nicotine dependence, cigarettes, uncomplicated: Secondary | ICD-10-CM | POA: Diagnosis not present

## 2018-05-26 DIAGNOSIS — Z23 Encounter for immunization: Secondary | ICD-10-CM | POA: Diagnosis not present

## 2018-05-26 DIAGNOSIS — E1165 Type 2 diabetes mellitus with hyperglycemia: Secondary | ICD-10-CM | POA: Diagnosis not present

## 2018-05-30 ENCOUNTER — Ambulatory Visit (HOSPITAL_COMMUNITY)
Admission: RE | Admit: 2018-05-30 | Discharge: 2018-05-30 | Disposition: A | Discharge status: Home / Self Care | Payer: Medicare Other | Source: Ambulatory Visit | Attending: Internal Medicine | Admitting: Internal Medicine

## 2018-05-30 DIAGNOSIS — Z1231 Encounter for screening mammogram for malignant neoplasm of breast: Secondary | ICD-10-CM

## 2018-07-14 DIAGNOSIS — F209 Schizophrenia, unspecified: Secondary | ICD-10-CM | POA: Diagnosis not present

## 2018-09-01 DIAGNOSIS — F209 Schizophrenia, unspecified: Secondary | ICD-10-CM | POA: Diagnosis not present

## 2018-09-05 DIAGNOSIS — E1165 Type 2 diabetes mellitus with hyperglycemia: Secondary | ICD-10-CM | POA: Diagnosis not present

## 2018-09-05 DIAGNOSIS — F29 Unspecified psychosis not due to a substance or known physiological condition: Secondary | ICD-10-CM | POA: Diagnosis not present

## 2018-09-05 DIAGNOSIS — F17210 Nicotine dependence, cigarettes, uncomplicated: Secondary | ICD-10-CM | POA: Diagnosis not present

## 2018-09-05 DIAGNOSIS — J410 Simple chronic bronchitis: Secondary | ICD-10-CM | POA: Diagnosis not present

## 2018-09-21 ENCOUNTER — Encounter: Payer: Self-pay | Admitting: Internal Medicine

## 2018-09-22 DIAGNOSIS — F209 Schizophrenia, unspecified: Secondary | ICD-10-CM | POA: Diagnosis not present

## 2018-09-28 DIAGNOSIS — F209 Schizophrenia, unspecified: Secondary | ICD-10-CM | POA: Diagnosis not present

## 2018-10-13 DIAGNOSIS — F209 Schizophrenia, unspecified: Secondary | ICD-10-CM | POA: Diagnosis not present

## 2018-10-17 DIAGNOSIS — M79674 Pain in right toe(s): Secondary | ICD-10-CM | POA: Diagnosis not present

## 2018-10-17 DIAGNOSIS — E119 Type 2 diabetes mellitus without complications: Secondary | ICD-10-CM | POA: Diagnosis not present

## 2018-10-17 DIAGNOSIS — B351 Tinea unguium: Secondary | ICD-10-CM | POA: Diagnosis not present

## 2018-10-17 DIAGNOSIS — M2041 Other hammer toe(s) (acquired), right foot: Secondary | ICD-10-CM | POA: Diagnosis not present

## 2018-11-03 DIAGNOSIS — F209 Schizophrenia, unspecified: Secondary | ICD-10-CM | POA: Diagnosis not present

## 2018-11-24 DIAGNOSIS — F209 Schizophrenia, unspecified: Secondary | ICD-10-CM | POA: Diagnosis not present

## 2018-12-07 DIAGNOSIS — F17290 Nicotine dependence, other tobacco product, uncomplicated: Secondary | ICD-10-CM | POA: Diagnosis not present

## 2018-12-07 DIAGNOSIS — J410 Simple chronic bronchitis: Secondary | ICD-10-CM | POA: Diagnosis not present

## 2018-12-07 DIAGNOSIS — E1165 Type 2 diabetes mellitus with hyperglycemia: Secondary | ICD-10-CM | POA: Diagnosis not present

## 2018-12-15 DIAGNOSIS — F209 Schizophrenia, unspecified: Secondary | ICD-10-CM | POA: Diagnosis not present

## 2019-01-05 DIAGNOSIS — F209 Schizophrenia, unspecified: Secondary | ICD-10-CM | POA: Diagnosis not present

## 2019-01-26 DIAGNOSIS — F209 Schizophrenia, unspecified: Secondary | ICD-10-CM | POA: Diagnosis not present

## 2019-02-23 DIAGNOSIS — F209 Schizophrenia, unspecified: Secondary | ICD-10-CM | POA: Diagnosis not present

## 2019-03-08 DIAGNOSIS — F17210 Nicotine dependence, cigarettes, uncomplicated: Secondary | ICD-10-CM | POA: Diagnosis not present

## 2019-03-08 DIAGNOSIS — F29 Unspecified psychosis not due to a substance or known physiological condition: Secondary | ICD-10-CM | POA: Diagnosis not present

## 2019-03-08 DIAGNOSIS — J410 Simple chronic bronchitis: Secondary | ICD-10-CM | POA: Diagnosis not present

## 2019-03-08 DIAGNOSIS — E1165 Type 2 diabetes mellitus with hyperglycemia: Secondary | ICD-10-CM | POA: Diagnosis not present

## 2019-03-08 DIAGNOSIS — F17290 Nicotine dependence, other tobacco product, uncomplicated: Secondary | ICD-10-CM | POA: Diagnosis not present

## 2019-03-09 ENCOUNTER — Other Ambulatory Visit: Payer: Self-pay

## 2019-03-13 IMAGING — MG DIGITAL SCREENING BILATERAL MAMMOGRAM WITH TOMO AND CAD
6 of 12 series · 6 of 36 positions shown · non-contrast
Comparison: Previous exam(s).

CLINICAL DATA: Screening.

EXAM:
DIGITAL SCREENING BILATERAL MAMMOGRAM WITH TOMO AND CAD

[R CC synth-2D (1 of 2)]
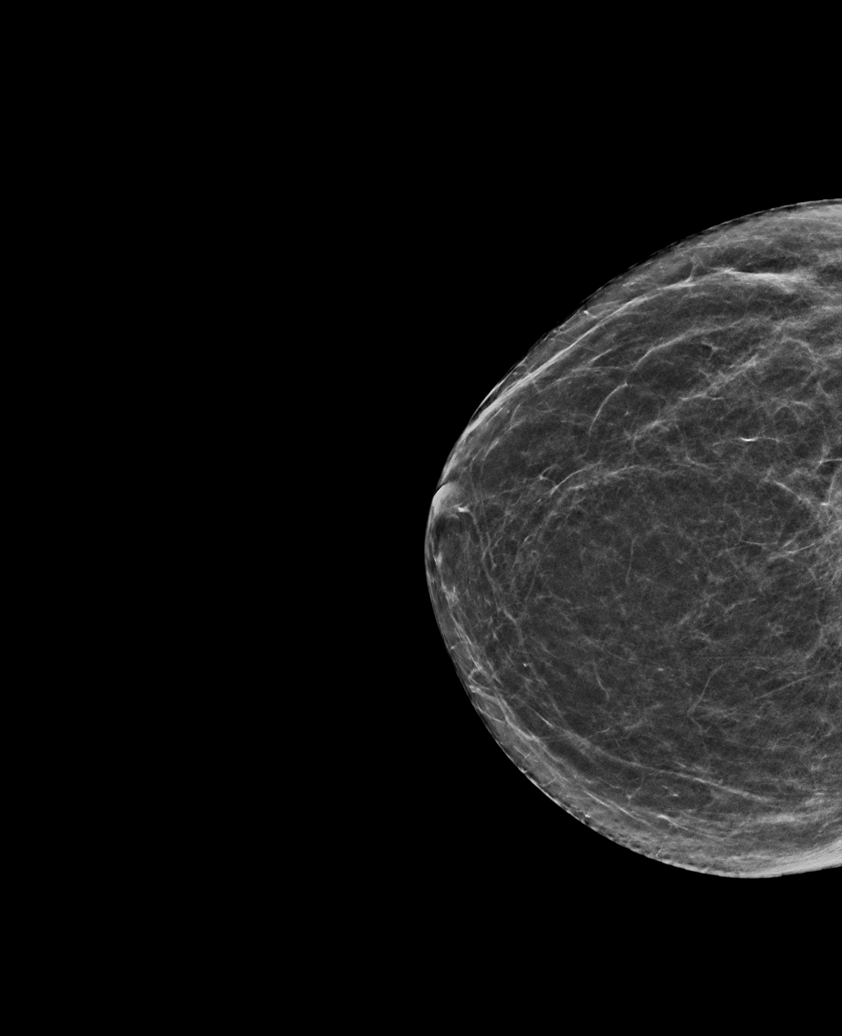

[R CC synth-2D (2 of 2)]
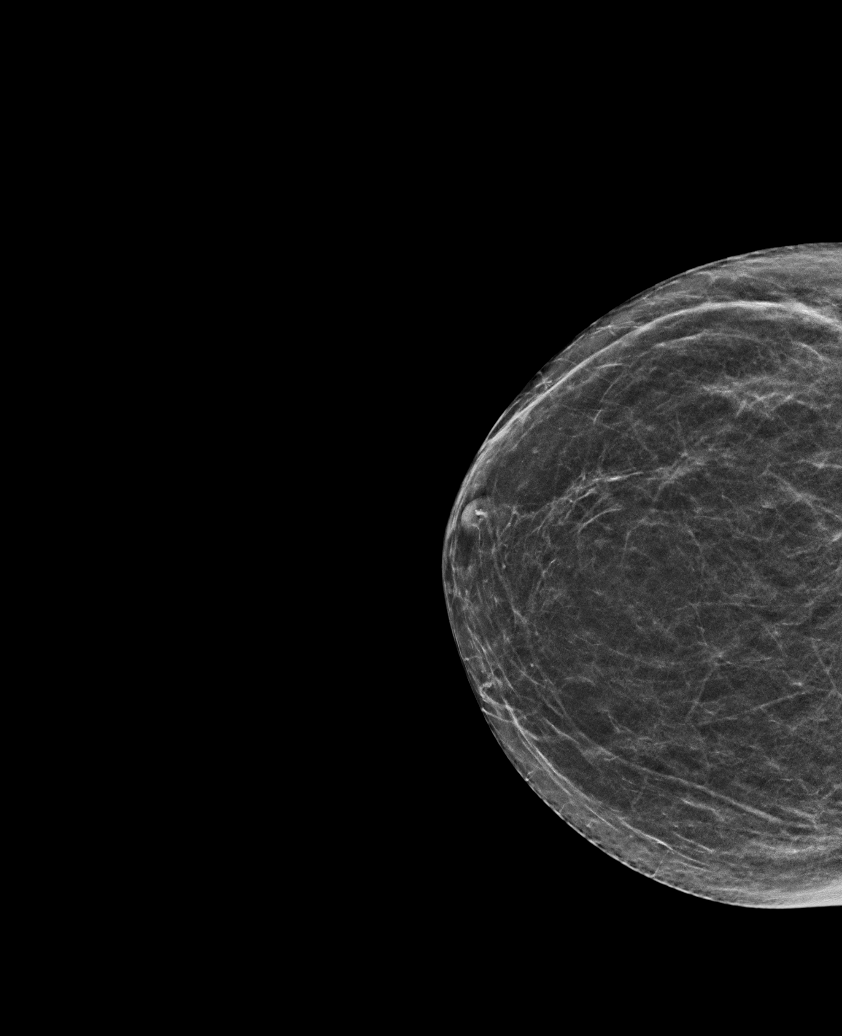

[L MLO synth-2D (1 of 2)]
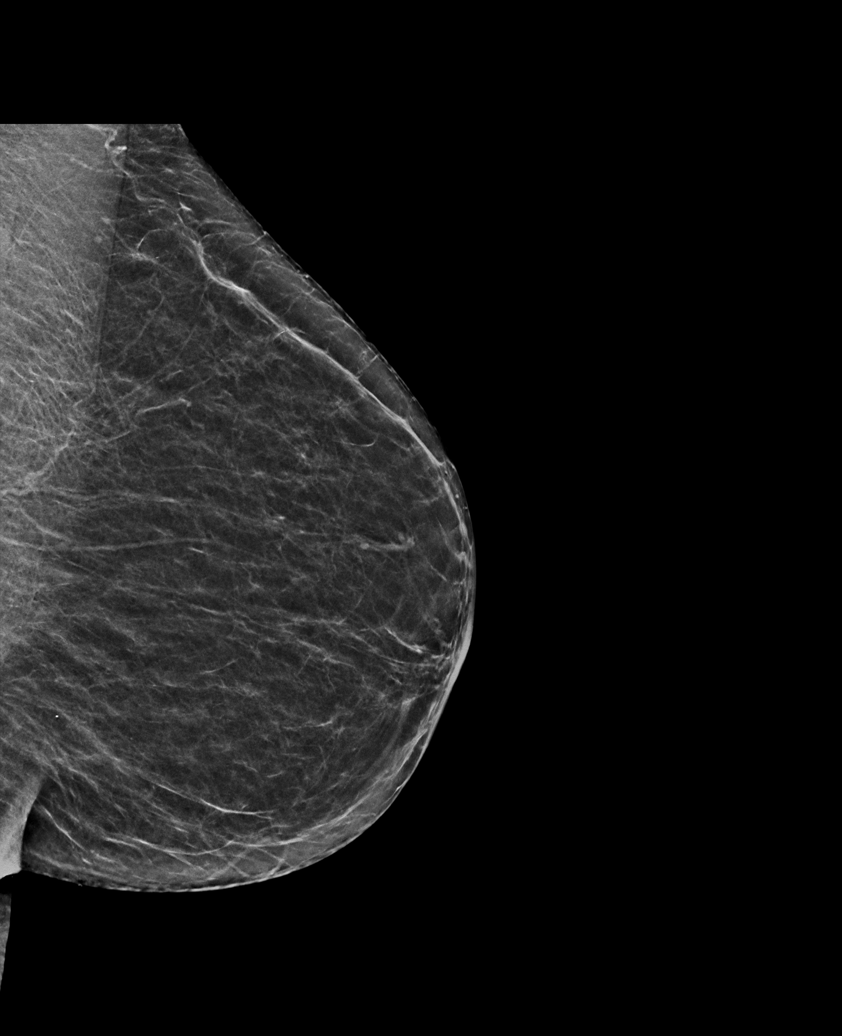

[L MLO synth-2D (2 of 2)]
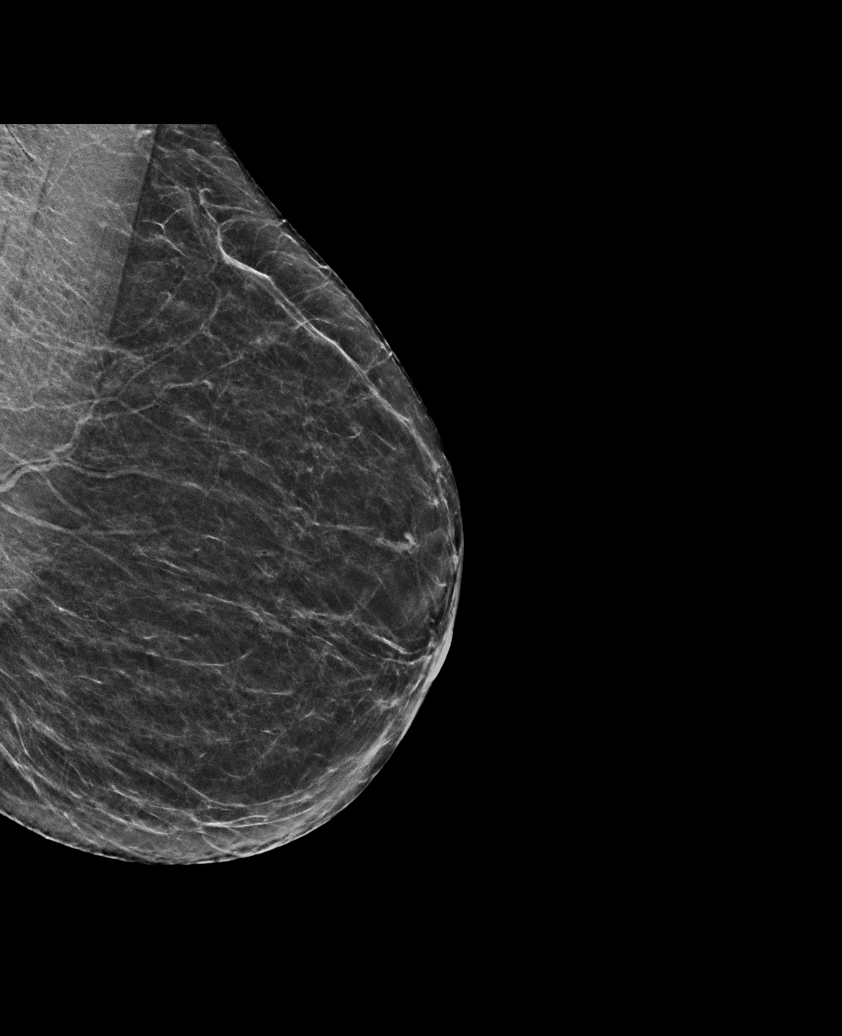

[R MLO synth-2D]
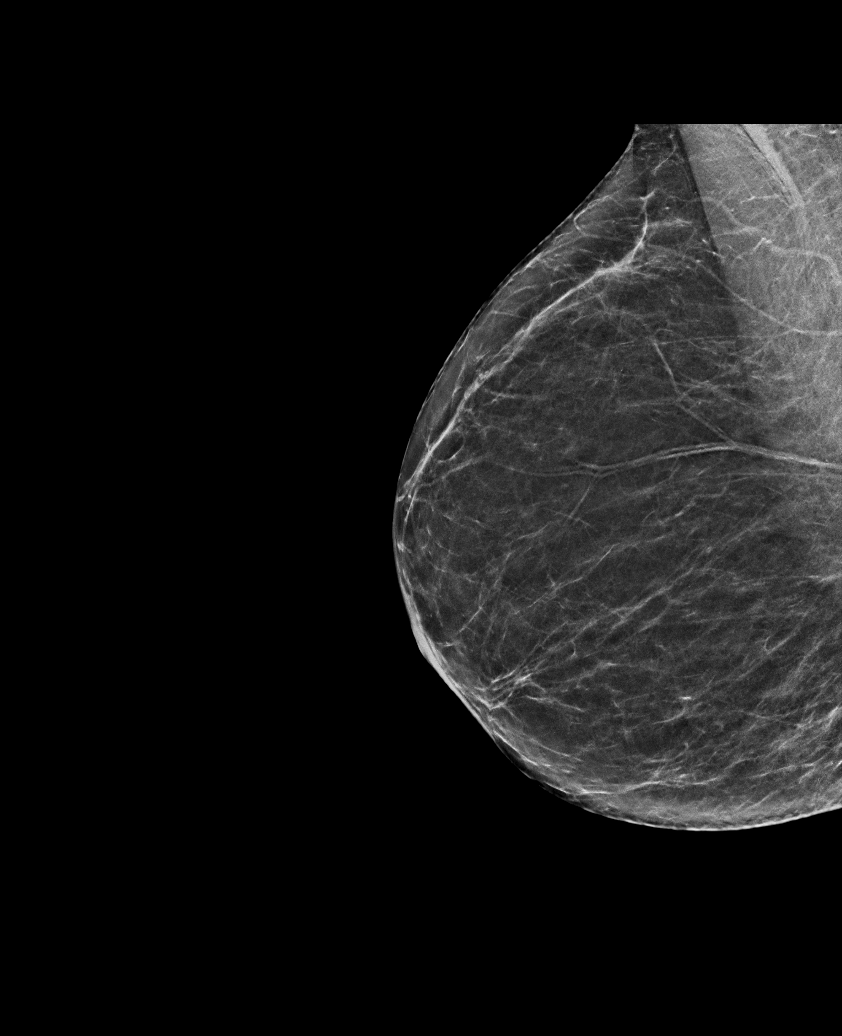

[L CC synth-2D]
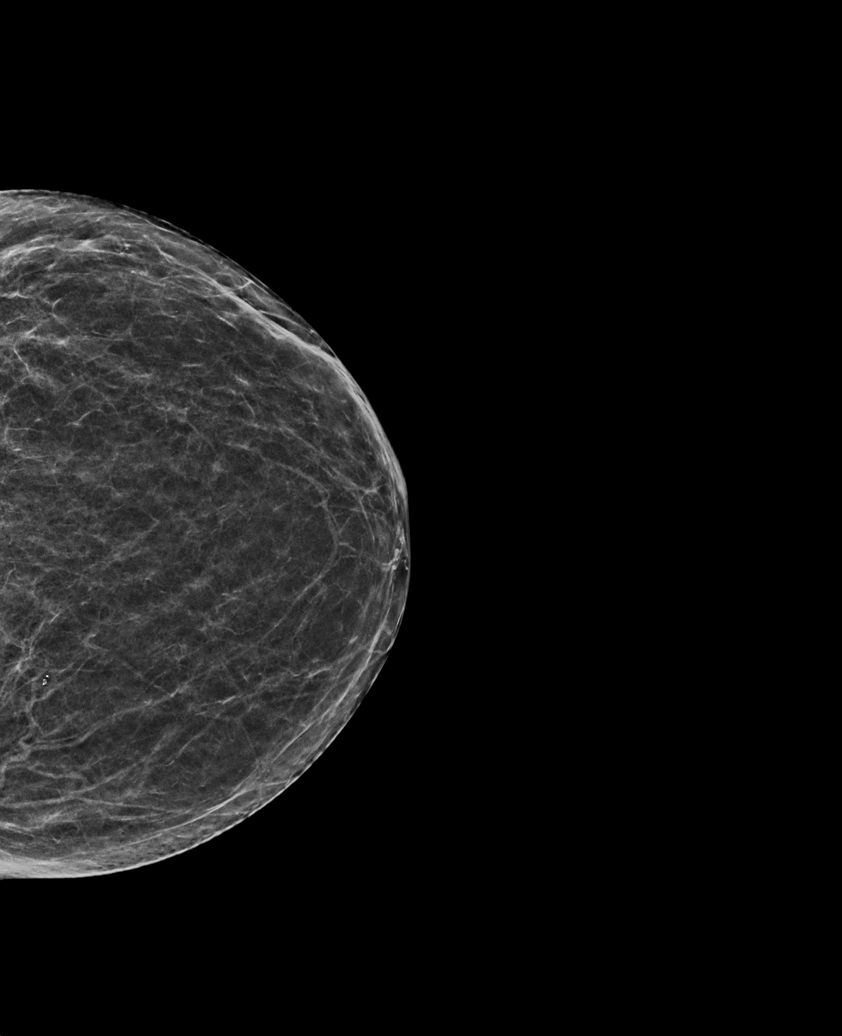

[6 of 36 positions shown; findings below may reference images not displayed]

ACR Breast Density Category b: There are scattered areas of
fibroglandular density.
FINDINGS: There are no findings suspicious for malignancy. Images were
processed with CAD.
IMPRESSION: No mammographic evidence of malignancy. A result letter of this
screening mammogram will be mailed directly to the patient.

RECOMMENDATION:
Screening mammogram in one year. (Code:[id number])

BI-RADS CATEGORY  1: Negative.

## 2019-03-16 DIAGNOSIS — F209 Schizophrenia, unspecified: Secondary | ICD-10-CM | POA: Diagnosis not present

## 2019-04-06 DIAGNOSIS — F209 Schizophrenia, unspecified: Secondary | ICD-10-CM | POA: Diagnosis not present

## 2019-04-08 DIAGNOSIS — E1165 Type 2 diabetes mellitus with hyperglycemia: Secondary | ICD-10-CM | POA: Diagnosis not present

## 2019-04-08 DIAGNOSIS — J410 Simple chronic bronchitis: Secondary | ICD-10-CM | POA: Diagnosis not present

## 2019-04-27 DIAGNOSIS — F209 Schizophrenia, unspecified: Secondary | ICD-10-CM | POA: Diagnosis not present

## 2019-04-28 ENCOUNTER — Other Ambulatory Visit (HOSPITAL_COMMUNITY): Payer: Self-pay | Admitting: Internal Medicine

## 2019-04-28 DIAGNOSIS — Z1231 Encounter for screening mammogram for malignant neoplasm of breast: Secondary | ICD-10-CM

## 2019-05-09 DIAGNOSIS — F29 Unspecified psychosis not due to a substance or known physiological condition: Secondary | ICD-10-CM | POA: Diagnosis not present

## 2019-05-09 DIAGNOSIS — E119 Type 2 diabetes mellitus without complications: Secondary | ICD-10-CM | POA: Diagnosis not present

## 2019-05-18 DIAGNOSIS — F209 Schizophrenia, unspecified: Secondary | ICD-10-CM | POA: Diagnosis not present

## 2019-06-05 ENCOUNTER — Ambulatory Visit (HOSPITAL_COMMUNITY): Payer: Medicare Other

## 2019-06-08 DIAGNOSIS — J410 Simple chronic bronchitis: Secondary | ICD-10-CM | POA: Diagnosis not present

## 2019-06-08 DIAGNOSIS — E1165 Type 2 diabetes mellitus with hyperglycemia: Secondary | ICD-10-CM | POA: Diagnosis not present

## 2019-06-29 DIAGNOSIS — F209 Schizophrenia, unspecified: Secondary | ICD-10-CM | POA: Diagnosis not present

## 2019-07-03 ENCOUNTER — Inpatient Hospital Stay (HOSPITAL_COMMUNITY): Admission: RE | Admit: 2019-07-03 | Payer: Medicare Other | Source: Ambulatory Visit

## 2019-07-20 DIAGNOSIS — F209 Schizophrenia, unspecified: Secondary | ICD-10-CM | POA: Diagnosis not present

## 2019-08-10 DIAGNOSIS — F209 Schizophrenia, unspecified: Secondary | ICD-10-CM | POA: Diagnosis not present

## 2019-08-28 DIAGNOSIS — Z23 Encounter for immunization: Secondary | ICD-10-CM | POA: Diagnosis not present

## 2019-08-31 DIAGNOSIS — F209 Schizophrenia, unspecified: Secondary | ICD-10-CM | POA: Diagnosis not present

## 2019-09-07 DIAGNOSIS — E1165 Type 2 diabetes mellitus with hyperglycemia: Secondary | ICD-10-CM | POA: Diagnosis not present

## 2019-09-07 DIAGNOSIS — J410 Simple chronic bronchitis: Secondary | ICD-10-CM | POA: Diagnosis not present

## 2019-09-21 DIAGNOSIS — F209 Schizophrenia, unspecified: Secondary | ICD-10-CM | POA: Diagnosis not present

## 2019-09-25 DIAGNOSIS — Z23 Encounter for immunization: Secondary | ICD-10-CM | POA: Diagnosis not present

## 2019-10-05 DIAGNOSIS — F209 Schizophrenia, unspecified: Secondary | ICD-10-CM | POA: Diagnosis not present

## 2019-10-08 DIAGNOSIS — J410 Simple chronic bronchitis: Secondary | ICD-10-CM | POA: Diagnosis not present

## 2019-10-08 DIAGNOSIS — E1165 Type 2 diabetes mellitus with hyperglycemia: Secondary | ICD-10-CM | POA: Diagnosis not present

## 2019-10-12 DIAGNOSIS — F209 Schizophrenia, unspecified: Secondary | ICD-10-CM | POA: Diagnosis not present

## 2019-11-02 DIAGNOSIS — F209 Schizophrenia, unspecified: Secondary | ICD-10-CM | POA: Diagnosis not present

## 2019-11-05 DIAGNOSIS — J410 Simple chronic bronchitis: Secondary | ICD-10-CM | POA: Diagnosis not present

## 2019-11-05 DIAGNOSIS — E1165 Type 2 diabetes mellitus with hyperglycemia: Secondary | ICD-10-CM | POA: Diagnosis not present

## 2019-11-14 DIAGNOSIS — J410 Simple chronic bronchitis: Secondary | ICD-10-CM | POA: Diagnosis not present

## 2019-11-14 DIAGNOSIS — E1165 Type 2 diabetes mellitus with hyperglycemia: Secondary | ICD-10-CM | POA: Diagnosis not present

## 2019-11-14 DIAGNOSIS — Z0001 Encounter for general adult medical examination with abnormal findings: Secondary | ICD-10-CM | POA: Diagnosis not present

## 2019-11-23 DIAGNOSIS — F209 Schizophrenia, unspecified: Secondary | ICD-10-CM | POA: Diagnosis not present

## 2019-12-14 DIAGNOSIS — F209 Schizophrenia, unspecified: Secondary | ICD-10-CM | POA: Diagnosis not present

## 2020-01-04 DIAGNOSIS — F209 Schizophrenia, unspecified: Secondary | ICD-10-CM | POA: Diagnosis not present

## 2020-01-05 DIAGNOSIS — E1165 Type 2 diabetes mellitus with hyperglycemia: Secondary | ICD-10-CM | POA: Diagnosis not present

## 2020-01-05 DIAGNOSIS — J410 Simple chronic bronchitis: Secondary | ICD-10-CM | POA: Diagnosis not present

## 2020-02-05 DIAGNOSIS — J410 Simple chronic bronchitis: Secondary | ICD-10-CM | POA: Diagnosis not present

## 2020-02-05 DIAGNOSIS — E1165 Type 2 diabetes mellitus with hyperglycemia: Secondary | ICD-10-CM | POA: Diagnosis not present

## 2020-02-15 DIAGNOSIS — F209 Schizophrenia, unspecified: Secondary | ICD-10-CM | POA: Diagnosis not present

## 2020-03-06 DIAGNOSIS — J410 Simple chronic bronchitis: Secondary | ICD-10-CM | POA: Diagnosis not present

## 2020-03-06 DIAGNOSIS — E1165 Type 2 diabetes mellitus with hyperglycemia: Secondary | ICD-10-CM | POA: Diagnosis not present

## 2020-03-14 DIAGNOSIS — F29 Unspecified psychosis not due to a substance or known physiological condition: Secondary | ICD-10-CM | POA: Diagnosis not present

## 2020-03-14 DIAGNOSIS — J410 Simple chronic bronchitis: Secondary | ICD-10-CM | POA: Diagnosis not present

## 2020-03-14 DIAGNOSIS — E1165 Type 2 diabetes mellitus with hyperglycemia: Secondary | ICD-10-CM | POA: Diagnosis not present

## 2020-03-14 DIAGNOSIS — F17210 Nicotine dependence, cigarettes, uncomplicated: Secondary | ICD-10-CM | POA: Diagnosis not present

## 2020-03-14 DIAGNOSIS — F17290 Nicotine dependence, other tobacco product, uncomplicated: Secondary | ICD-10-CM | POA: Diagnosis not present

## 2020-03-21 DIAGNOSIS — F209 Schizophrenia, unspecified: Secondary | ICD-10-CM | POA: Diagnosis not present

## 2020-03-28 DIAGNOSIS — F209 Schizophrenia, unspecified: Secondary | ICD-10-CM | POA: Diagnosis not present

## 2020-04-14 DIAGNOSIS — E1165 Type 2 diabetes mellitus with hyperglycemia: Secondary | ICD-10-CM | POA: Diagnosis not present

## 2020-04-14 DIAGNOSIS — J410 Simple chronic bronchitis: Secondary | ICD-10-CM | POA: Diagnosis not present

## 2020-04-18 DIAGNOSIS — F209 Schizophrenia, unspecified: Secondary | ICD-10-CM | POA: Diagnosis not present

## 2020-05-10 ENCOUNTER — Other Ambulatory Visit (HOSPITAL_COMMUNITY): Payer: Self-pay | Admitting: Internal Medicine

## 2020-05-10 DIAGNOSIS — Z1231 Encounter for screening mammogram for malignant neoplasm of breast: Secondary | ICD-10-CM

## 2020-05-14 DIAGNOSIS — J410 Simple chronic bronchitis: Secondary | ICD-10-CM | POA: Diagnosis not present

## 2020-05-14 DIAGNOSIS — E1165 Type 2 diabetes mellitus with hyperglycemia: Secondary | ICD-10-CM | POA: Diagnosis not present

## 2020-05-16 DIAGNOSIS — F209 Schizophrenia, unspecified: Secondary | ICD-10-CM | POA: Diagnosis not present

## 2020-06-06 DIAGNOSIS — F209 Schizophrenia, unspecified: Secondary | ICD-10-CM | POA: Diagnosis not present

## 2020-06-07 ENCOUNTER — Other Ambulatory Visit: Payer: Self-pay

## 2020-06-07 ENCOUNTER — Ambulatory Visit (HOSPITAL_COMMUNITY)
Admission: RE | Admit: 2020-06-07 | Discharge: 2020-06-07 | Disposition: A | Discharge status: Home / Self Care | Payer: Medicare HMO | Source: Ambulatory Visit | Attending: Internal Medicine | Admitting: Internal Medicine

## 2020-06-07 DIAGNOSIS — Z1231 Encounter for screening mammogram for malignant neoplasm of breast: Secondary | ICD-10-CM | POA: Insufficient documentation

## 2020-06-14 DIAGNOSIS — J410 Simple chronic bronchitis: Secondary | ICD-10-CM | POA: Diagnosis not present

## 2020-06-14 DIAGNOSIS — E1165 Type 2 diabetes mellitus with hyperglycemia: Secondary | ICD-10-CM | POA: Diagnosis not present

## 2020-06-26 DIAGNOSIS — Z23 Encounter for immunization: Secondary | ICD-10-CM | POA: Diagnosis not present

## 2020-06-27 DIAGNOSIS — F209 Schizophrenia, unspecified: Secondary | ICD-10-CM | POA: Diagnosis not present

## 2020-07-15 ENCOUNTER — Ambulatory Visit (HOSPITAL_COMMUNITY)
Admission: RE | Admit: 2020-07-15 | Discharge: 2020-07-15 | Disposition: A | Discharge status: Home / Self Care | Payer: Medicare HMO | Source: Ambulatory Visit | Attending: Internal Medicine | Admitting: Internal Medicine

## 2020-07-15 ENCOUNTER — Other Ambulatory Visit: Payer: Self-pay

## 2020-07-15 ENCOUNTER — Other Ambulatory Visit (HOSPITAL_COMMUNITY): Payer: Self-pay | Admitting: Internal Medicine

## 2020-07-15 DIAGNOSIS — R059 Cough, unspecified: Secondary | ICD-10-CM

## 2020-07-18 DIAGNOSIS — Z1389 Encounter for screening for other disorder: Secondary | ICD-10-CM | POA: Diagnosis not present

## 2020-07-18 DIAGNOSIS — E1165 Type 2 diabetes mellitus with hyperglycemia: Secondary | ICD-10-CM | POA: Diagnosis not present

## 2020-07-18 DIAGNOSIS — Z1331 Encounter for screening for depression: Secondary | ICD-10-CM | POA: Diagnosis not present

## 2020-07-18 DIAGNOSIS — Z0001 Encounter for general adult medical examination with abnormal findings: Secondary | ICD-10-CM | POA: Diagnosis not present

## 2020-07-18 DIAGNOSIS — F17210 Nicotine dependence, cigarettes, uncomplicated: Secondary | ICD-10-CM | POA: Diagnosis not present

## 2020-07-18 DIAGNOSIS — F29 Unspecified psychosis not due to a substance or known physiological condition: Secondary | ICD-10-CM | POA: Diagnosis not present

## 2020-07-18 DIAGNOSIS — E782 Mixed hyperlipidemia: Secondary | ICD-10-CM | POA: Diagnosis not present

## 2020-07-22 DIAGNOSIS — Z0001 Encounter for general adult medical examination with abnormal findings: Secondary | ICD-10-CM | POA: Diagnosis not present

## 2020-07-22 DIAGNOSIS — E1165 Type 2 diabetes mellitus with hyperglycemia: Secondary | ICD-10-CM | POA: Diagnosis not present

## 2020-07-22 DIAGNOSIS — Z79899 Other long term (current) drug therapy: Secondary | ICD-10-CM | POA: Diagnosis not present

## 2020-07-23 DIAGNOSIS — F209 Schizophrenia, unspecified: Secondary | ICD-10-CM | POA: Diagnosis not present

## 2020-08-08 DIAGNOSIS — F209 Schizophrenia, unspecified: Secondary | ICD-10-CM | POA: Diagnosis not present

## 2020-08-18 DIAGNOSIS — F29 Unspecified psychosis not due to a substance or known physiological condition: Secondary | ICD-10-CM | POA: Diagnosis not present

## 2020-08-18 DIAGNOSIS — E11620 Type 2 diabetes mellitus with diabetic dermatitis: Secondary | ICD-10-CM | POA: Diagnosis not present

## 2020-08-23 ENCOUNTER — Encounter: Payer: Medicaid Other | Admitting: Obstetrics & Gynecology

## 2020-08-29 DIAGNOSIS — F209 Schizophrenia, unspecified: Secondary | ICD-10-CM | POA: Diagnosis not present

## 2020-09-18 DIAGNOSIS — E1165 Type 2 diabetes mellitus with hyperglycemia: Secondary | ICD-10-CM | POA: Diagnosis not present

## 2020-09-18 DIAGNOSIS — J410 Simple chronic bronchitis: Secondary | ICD-10-CM | POA: Diagnosis not present

## 2020-09-19 DIAGNOSIS — F209 Schizophrenia, unspecified: Secondary | ICD-10-CM | POA: Diagnosis not present

## 2020-09-23 ENCOUNTER — Encounter: Payer: Medicaid Other | Admitting: Obstetrics & Gynecology

## 2020-09-24 DIAGNOSIS — F209 Schizophrenia, unspecified: Secondary | ICD-10-CM | POA: Diagnosis not present

## 2020-10-10 DIAGNOSIS — F209 Schizophrenia, unspecified: Secondary | ICD-10-CM | POA: Diagnosis not present

## 2020-10-15 ENCOUNTER — Encounter: Payer: Self-pay | Admitting: Obstetrics & Gynecology

## 2020-10-15 ENCOUNTER — Other Ambulatory Visit (HOSPITAL_COMMUNITY)
Admission: RE | Admit: 2020-10-15 | Discharge: 2020-10-15 | Disposition: A | Discharge status: Home / Self Care | Payer: Medicare HMO | Source: Ambulatory Visit | Attending: Obstetrics & Gynecology | Admitting: Obstetrics & Gynecology

## 2020-10-15 ENCOUNTER — Ambulatory Visit (INDEPENDENT_AMBULATORY_CARE_PROVIDER_SITE_OTHER): Payer: Medicare HMO | Admitting: Obstetrics & Gynecology

## 2020-10-15 ENCOUNTER — Other Ambulatory Visit: Payer: Self-pay

## 2020-10-15 DIAGNOSIS — R87810 Cervical high risk human papillomavirus (HPV) DNA test positive: Secondary | ICD-10-CM | POA: Diagnosis not present

## 2020-10-15 DIAGNOSIS — Z1211 Encounter for screening for malignant neoplasm of colon: Secondary | ICD-10-CM | POA: Diagnosis not present

## 2020-10-15 DIAGNOSIS — Z01419 Encounter for gynecological examination (general) (routine) without abnormal findings: Secondary | ICD-10-CM | POA: Insufficient documentation

## 2020-10-15 DIAGNOSIS — Z1151 Encounter for screening for human papillomavirus (HPV): Secondary | ICD-10-CM | POA: Diagnosis not present

## 2020-10-15 DIAGNOSIS — R87613 High grade squamous intraepithelial lesion on cytologic smear of cervix (HGSIL): Secondary | ICD-10-CM | POA: Diagnosis not present

## 2020-10-15 DIAGNOSIS — Z1212 Encounter for screening for malignant neoplasm of rectum: Secondary | ICD-10-CM | POA: Diagnosis not present

## 2020-10-15 NOTE — Progress Notes (Signed)
Subjective:     Regina Lambert is a 70 y.o. female here for a routine exam.  No LMP recorded. Patient has had a hysterectomy. [id number] Birth Control Method:  menopausal Menstrual Calendar(currently): amenorrheic  Current complaints: no gyn complaints.   Current acute medical issues:  schizophrenia   Recent Gynecologic History No LMP recorded. Patient has had a hysterectomy. Last Pap: ?,  ?result Last mammogram: 10/21,  normal  Past Medical History:  Diagnosis Date   Diabetes ([place])    Hyperlipidemia    Hypertension    Polyneuropathy    Poor historian    Psychotic disorder ([place])    schizophrenia    Past Surgical History:  Procedure Laterality Date   ABDOMINAL HYSTERECTOMY     BLADDER SURGERY     COLONOSCOPY WITH PROPOFOL N/A 10/07/2015   Procedure: COLONOSCOPY WITH PROPOFOL;  Surgeon: Daneil Dolin, MD;  Location: [facility];  Service: Endoscopy;  Laterality: N/A;  1030 - moved to 11:15 - office to notify   POLYPECTOMY  10/07/2015   Procedure: POLYPECTOMY;  Surgeon: Daneil Dolin, MD;  Location: [facility];  Service: Endoscopy;;  hepatic flexsure   TUBAL LIGATION      OB History    Gravida  4   Para  3   Term  3   Preterm      AB  1   Living  3     SAB      IAB      Ectopic      Multiple      Live Births  3           Social History   Socioeconomic History   Marital status: Widowed    Spouse name: Not on file   Number of children: Not on file   Years of education: Not on file   Highest education level: Not on file  Occupational History   Not on file  Tobacco Use   Smoking status: Current Some Day Smoker    Packs/day: 0.50    Years: 18.00    Pack years: 9.00    Types: Cigarettes   Smokeless tobacco: Never Used  Vaping Use   Vaping Use: Never used  Substance and Sexual Activity   Alcohol use: No    Alcohol/week: 0.0 standard drinks   Drug use: No   Sexual activity: Yes    Birth control/protection:  Post-menopausal  Other Topics Concern   Not on file  Social History Narrative   Not on file   Social Determinants of Health   Financial Resource Strain: Low Risk    Difficulty of Paying Living Expenses: Not very hard  Food Insecurity: No Food Insecurity   Worried About Charity fundraiser in the Last Year: Never true   Ran Out of Food in the Last Year: Never true  Transportation Needs: No Transportation Needs   Lack of Transportation (Medical): No   Lack of Transportation (Non-Medical): No  Physical Activity: Inactive   Days of Exercise per Week: 0 days   Minutes of Exercise per Session: 20 min  Stress: No Stress Concern Present   Feeling of Stress : Not at all  Social Connections: Socially Isolated   Frequency of Communication with Friends and Family: Once a week   Frequency of Social Gatherings with Friends and Family: Once a week   Attends Religious Services: 1 to 4 times per year   Active Member of Clubs or Organizations: No   Attends  Club or Organization Meetings: Never   Marital Status: Widowed    Family History  Problem Relation Age of Onset   Colon cancer Neg Hx      Current Outpatient Medications:    acetaminophen (TYLENOL) 325 MG tablet, Take 650 mg by mouth every 6 (six) hours as needed for moderate pain., Disp: , Rfl:    albuterol (PROVENTIL HFA;VENTOLIN HFA) 108 (90 BASE) MCG/ACT inhaler, Inhale 2 puffs into the lungs every 6 (six) hours as needed for wheezing or shortness of breath., Disp: , Rfl:    benztropine (COGENTIN) 0.5 MG tablet, Take 0.5 mg by mouth 2 (two) times daily., Disp: , Rfl:    haloperidol (HALDOL) 5 MG tablet, Take 5 mg by mouth 2 (two) times daily., Disp: , Rfl:    haloperidol decanoate (HALDOL DECANOATE) 100 MG/ML injection, Inject 50 mg as directed every 21 ( twenty-one) days. , Disp: , Rfl:    metFORMIN (GLUCOPHAGE) 500 MG tablet, Take 500 mg by mouth 2 (two) times daily with a meal. , Disp: , Rfl:    metoprolol  tartrate (LOPRESSOR) 25 MG tablet, , Disp: , Rfl:    simvastatin (ZOCOR) 20 MG tablet, , Disp: , Rfl:    TRADJENTA 5 MG TABS tablet, Take 1 tablet by mouth daily., Disp: , Rfl:   Review of Systems  Review of Systems  Constitutional: Negative for fever, chills, weight loss, malaise/fatigue and diaphoresis.  HENT: Negative for hearing loss, ear pain, nosebleeds, congestion, sore throat, neck pain, tinnitus and ear discharge.   Eyes: Negative for blurred vision, double vision, photophobia, pain, discharge and redness.  Respiratory: Negative for cough, hemoptysis, sputum production, shortness of breath, wheezing and stridor.   Cardiovascular: Negative for chest pain, palpitations, orthopnea, claudication, leg swelling and PND.  Gastrointestinal: negative for abdominal pain. Negative for heartburn, nausea, vomiting, diarrhea, constipation, blood in stool and melena.  Genitourinary: Negative for dysuria, urgency, frequency, hematuria and flank pain.  Musculoskeletal: Negative for myalgias, back pain, joint pain and falls.  Skin: Negative for itching and rash.  Neurological: Negative for dizziness, tingling, tremors, sensory change, speech change, focal weakness, seizures, loss of consciousness, weakness and headaches.  Endo/Heme/Allergies: Negative for environmental allergies and polydipsia. Does not bruise/bleed easily.  Psychiatric/Behavioral: Negative for depression, suicidal ideas, hallucinations, memory loss and substance abuse. The patient is not nervous/anxious and does not have insomnia.        Objective:  There were no vitals taken for this visit.   Physical Exam  Vitals reviewed. Constitutional: She is oriented to person, place, and time. She appears well-developed and well-nourished.  HENT:  Head: Normocephalic and atraumatic.        Right Ear: External ear normal.  Left Ear: External ear normal.  Nose: Nose normal.  Mouth/Throat: Oropharynx is clear and moist.  Eyes:  Conjunctivae and EOM are normal. Pupils are equal, round, and reactive to light. Right eye exhibits no discharge. Left eye exhibits no discharge. No scleral icterus.  Neck: Normal range of motion. Neck supple. No tracheal deviation present. No thyromegaly present.  Cardiovascular: Normal rate, regular rhythm, normal heart sounds and intact distal pulses.  Exam reveals no gallop and no friction rub.   No murmur heard. Respiratory: Effort normal and breath sounds normal. No respiratory distress. She has no wheezes. She has no rales. She exhibits no tenderness.  GI: Soft. Bowel sounds are normal. She exhibits no distension and no mass. There is no tenderness. There is no rebound and no guarding.  Genitourinary:  Breasts no masses skin changes or nipple changes bilaterally      Vulva is normal without lesions Vagina is pink moist without discharge Cervix normal in appearance and pap is done Uterus is normal size shape and contour Adnexa is negative with normal sized ovaries  {Rectal    hemoccult negative, normal tone, no masses  Musculoskeletal: Normal range of motion. She exhibits no edema and no tenderness.  Neurological: She is alert and oriented to person, place, and time. She has normal reflexes. She displays normal reflexes. No cranial nerve deficit. She exhibits normal muscle tone. Coordination normal.  Skin: Skin is warm and dry. No rash noted. No erythema. No pallor.  Psychiatric: She has a normal mood and affect. Her behavior is normal. Judgment and thought content normal.       Medications Ordered at today's visit: No orders of the defined types were placed in this encounter.   Other orders placed at today's visit: No orders of the defined types were placed in this encounter.     Assessment:    Normal Gyn exam.    Plan:    Contraception: post menopausal status. Mammogram ordered. Follow up in: 3 years.     Return in about 3 years (around 10/16/2023) for yearly.

## 2020-10-17 DIAGNOSIS — F17290 Nicotine dependence, other tobacco product, uncomplicated: Secondary | ICD-10-CM | POA: Diagnosis not present

## 2020-10-17 DIAGNOSIS — E1165 Type 2 diabetes mellitus with hyperglycemia: Secondary | ICD-10-CM | POA: Diagnosis not present

## 2020-10-21 LAB — CYTOLOGY - PAP
Comment: NEGATIVE
Comment: NEGATIVE
Diagnosis: HIGH — AB
HPV 16: NEGATIVE
HPV 18 / 45: NEGATIVE
High risk HPV: POSITIVE — AB

## 2020-10-31 DIAGNOSIS — F209 Schizophrenia, unspecified: Secondary | ICD-10-CM | POA: Diagnosis not present

## 2020-11-08 ENCOUNTER — Encounter: Payer: Medicaid Other | Admitting: Obstetrics & Gynecology

## 2020-11-08 DIAGNOSIS — H524 Presbyopia: Secondary | ICD-10-CM | POA: Diagnosis not present

## 2020-11-17 DIAGNOSIS — E1165 Type 2 diabetes mellitus with hyperglycemia: Secondary | ICD-10-CM | POA: Diagnosis not present

## 2020-11-17 DIAGNOSIS — F17290 Nicotine dependence, other tobacco product, uncomplicated: Secondary | ICD-10-CM | POA: Diagnosis not present

## 2020-11-21 DIAGNOSIS — F209 Schizophrenia, unspecified: Secondary | ICD-10-CM | POA: Diagnosis not present

## 2020-12-12 DIAGNOSIS — F209 Schizophrenia, unspecified: Secondary | ICD-10-CM | POA: Diagnosis not present

## 2020-12-17 DIAGNOSIS — E1165 Type 2 diabetes mellitus with hyperglycemia: Secondary | ICD-10-CM | POA: Diagnosis not present

## 2020-12-17 DIAGNOSIS — F29 Unspecified psychosis not due to a substance or known physiological condition: Secondary | ICD-10-CM | POA: Diagnosis not present

## 2021-01-02 DIAGNOSIS — F209 Schizophrenia, unspecified: Secondary | ICD-10-CM | POA: Diagnosis not present

## 2021-01-16 DIAGNOSIS — I10 Essential (primary) hypertension: Secondary | ICD-10-CM | POA: Diagnosis not present

## 2021-01-16 DIAGNOSIS — F29 Unspecified psychosis not due to a substance or known physiological condition: Secondary | ICD-10-CM | POA: Diagnosis not present

## 2021-01-16 DIAGNOSIS — F17290 Nicotine dependence, other tobacco product, uncomplicated: Secondary | ICD-10-CM | POA: Diagnosis not present

## 2021-01-16 DIAGNOSIS — E1165 Type 2 diabetes mellitus with hyperglycemia: Secondary | ICD-10-CM | POA: Diagnosis not present

## 2021-01-23 DIAGNOSIS — F209 Schizophrenia, unspecified: Secondary | ICD-10-CM | POA: Diagnosis not present

## 2021-02-13 DIAGNOSIS — F209 Schizophrenia, unspecified: Secondary | ICD-10-CM | POA: Diagnosis not present

## 2021-02-15 DIAGNOSIS — F29 Unspecified psychosis not due to a substance or known physiological condition: Secondary | ICD-10-CM | POA: Diagnosis not present

## 2021-02-15 DIAGNOSIS — E1165 Type 2 diabetes mellitus with hyperglycemia: Secondary | ICD-10-CM | POA: Diagnosis not present

## 2021-03-06 DIAGNOSIS — F209 Schizophrenia, unspecified: Secondary | ICD-10-CM | POA: Diagnosis not present

## 2021-03-14 DIAGNOSIS — F209 Schizophrenia, unspecified: Secondary | ICD-10-CM | POA: Diagnosis not present

## 2021-03-18 DIAGNOSIS — F29 Unspecified psychosis not due to a substance or known physiological condition: Secondary | ICD-10-CM | POA: Diagnosis not present

## 2021-03-18 DIAGNOSIS — E1165 Type 2 diabetes mellitus with hyperglycemia: Secondary | ICD-10-CM | POA: Diagnosis not present

## 2021-03-27 DIAGNOSIS — F209 Schizophrenia, unspecified: Secondary | ICD-10-CM | POA: Diagnosis not present

## 2021-04-17 DIAGNOSIS — F209 Schizophrenia, unspecified: Secondary | ICD-10-CM | POA: Diagnosis not present

## 2021-04-18 DIAGNOSIS — F29 Unspecified psychosis not due to a substance or known physiological condition: Secondary | ICD-10-CM | POA: Diagnosis not present

## 2021-04-18 DIAGNOSIS — E1165 Type 2 diabetes mellitus with hyperglycemia: Secondary | ICD-10-CM | POA: Diagnosis not present

## 2021-05-08 DIAGNOSIS — F209 Schizophrenia, unspecified: Secondary | ICD-10-CM | POA: Diagnosis not present

## 2021-05-18 DIAGNOSIS — F29 Unspecified psychosis not due to a substance or known physiological condition: Secondary | ICD-10-CM | POA: Diagnosis not present

## 2021-05-18 DIAGNOSIS — E1165 Type 2 diabetes mellitus with hyperglycemia: Secondary | ICD-10-CM | POA: Diagnosis not present

## 2021-05-19 ENCOUNTER — Other Ambulatory Visit (HOSPITAL_COMMUNITY): Payer: Self-pay | Admitting: Internal Medicine

## 2021-05-19 DIAGNOSIS — Z1231 Encounter for screening mammogram for malignant neoplasm of breast: Secondary | ICD-10-CM

## 2021-05-29 ENCOUNTER — Ambulatory Visit (HOSPITAL_COMMUNITY): Payer: Medicaid Other

## 2021-05-29 DIAGNOSIS — F209 Schizophrenia, unspecified: Secondary | ICD-10-CM | POA: Diagnosis not present

## 2021-06-09 ENCOUNTER — Ambulatory Visit (HOSPITAL_COMMUNITY)
Admission: RE | Admit: 2021-06-09 | Discharge: 2021-06-09 | Disposition: A | Discharge status: Home / Self Care | Payer: Medicare HMO | Source: Ambulatory Visit | Attending: Internal Medicine | Admitting: Internal Medicine

## 2021-06-09 ENCOUNTER — Other Ambulatory Visit: Payer: Self-pay

## 2021-06-09 DIAGNOSIS — Z1231 Encounter for screening mammogram for malignant neoplasm of breast: Secondary | ICD-10-CM | POA: Diagnosis not present

## 2021-06-18 DIAGNOSIS — E1165 Type 2 diabetes mellitus with hyperglycemia: Secondary | ICD-10-CM | POA: Diagnosis not present

## 2021-06-18 DIAGNOSIS — F29 Unspecified psychosis not due to a substance or known physiological condition: Secondary | ICD-10-CM | POA: Diagnosis not present

## 2021-06-19 DIAGNOSIS — F209 Schizophrenia, unspecified: Secondary | ICD-10-CM | POA: Diagnosis not present

## 2021-07-10 DIAGNOSIS — F209 Schizophrenia, unspecified: Secondary | ICD-10-CM | POA: Diagnosis not present

## 2021-07-31 DIAGNOSIS — F209 Schizophrenia, unspecified: Secondary | ICD-10-CM | POA: Diagnosis not present

## 2021-08-07 DIAGNOSIS — F29 Unspecified psychosis not due to a substance or known physiological condition: Secondary | ICD-10-CM | POA: Diagnosis not present

## 2021-08-07 DIAGNOSIS — F17210 Nicotine dependence, cigarettes, uncomplicated: Secondary | ICD-10-CM | POA: Diagnosis not present

## 2021-08-07 DIAGNOSIS — Z1331 Encounter for screening for depression: Secondary | ICD-10-CM | POA: Diagnosis not present

## 2021-08-07 DIAGNOSIS — E782 Mixed hyperlipidemia: Secondary | ICD-10-CM | POA: Diagnosis not present

## 2021-08-07 DIAGNOSIS — E1165 Type 2 diabetes mellitus with hyperglycemia: Secondary | ICD-10-CM | POA: Diagnosis not present

## 2021-08-07 DIAGNOSIS — Z1389 Encounter for screening for other disorder: Secondary | ICD-10-CM | POA: Diagnosis not present

## 2021-08-07 DIAGNOSIS — Z0001 Encounter for general adult medical examination with abnormal findings: Secondary | ICD-10-CM | POA: Diagnosis not present

## 2021-08-21 DIAGNOSIS — F209 Schizophrenia, unspecified: Secondary | ICD-10-CM | POA: Diagnosis not present

## 2021-08-27 DIAGNOSIS — E1165 Type 2 diabetes mellitus with hyperglycemia: Secondary | ICD-10-CM | POA: Diagnosis not present

## 2021-08-27 DIAGNOSIS — Z0001 Encounter for general adult medical examination with abnormal findings: Secondary | ICD-10-CM | POA: Diagnosis not present

## 2021-08-28 DIAGNOSIS — F209 Schizophrenia, unspecified: Secondary | ICD-10-CM | POA: Diagnosis not present

## 2021-09-07 DIAGNOSIS — E1165 Type 2 diabetes mellitus with hyperglycemia: Secondary | ICD-10-CM | POA: Diagnosis not present

## 2021-09-07 DIAGNOSIS — F29 Unspecified psychosis not due to a substance or known physiological condition: Secondary | ICD-10-CM | POA: Diagnosis not present

## 2021-09-10 ENCOUNTER — Other Ambulatory Visit: Payer: Self-pay

## 2021-09-10 ENCOUNTER — Ambulatory Visit (INDEPENDENT_AMBULATORY_CARE_PROVIDER_SITE_OTHER): Payer: Medicare HMO | Admitting: Podiatry

## 2021-09-10 ENCOUNTER — Encounter: Payer: Self-pay | Admitting: Podiatry

## 2021-09-10 DIAGNOSIS — M79675 Pain in left toe(s): Secondary | ICD-10-CM

## 2021-09-10 DIAGNOSIS — B351 Tinea unguium: Secondary | ICD-10-CM | POA: Diagnosis not present

## 2021-09-10 DIAGNOSIS — E119 Type 2 diabetes mellitus without complications: Secondary | ICD-10-CM

## 2021-09-10 DIAGNOSIS — M79674 Pain in right toe(s): Secondary | ICD-10-CM | POA: Diagnosis not present

## 2021-09-10 NOTE — Progress Notes (Signed)
This patient returns to my office for at risk foot care.  This patient requires this care by a professional since this patient will be at risk due to having diabetes mellitus.  This patient is unable to cut nails herself since the patient cannot reach her nails.These nails are painful walking and wearing shoes.  This patient presents for at risk foot care today.  General Appearance  Alert, conversant and in no acute stress.  Vascular  Dorsalis pedis and posterior tibial  pulses are palpable  bilaterally.  Capillary return is within normal limits  bilaterally. Temperature is within normal limits  bilaterally.  Neurologic  Senn-Weinstein monofilament wire test within normal limits  bilaterally. Muscle power within normal limits bilaterally.  Nails Thick disfigured discolored nails with subungual debris  from hallux to fifth toes bilaterally. No evidence of bacterial infection or drainage bilaterally.  Orthopedic  No limitations of motion  feet .  No crepitus or effusions noted.  No bony pathology or digital deformities noted.  Skin  normotropic skin with no porokeratosis noted bilaterally.  No signs of infections or ulcers noted.     Onychomycosis  Pain in right toes  Pain in left toes  Consent was obtained for treatment procedures.   Mechanical debridement of nails 1-5  bilaterally performed with a nail nipper.  Filed with dremel without incident.    Return office visit    4 months                  Told patient to return for periodic foot care and evaluation due to potential at risk complications.   Gardiner Barefoot DPM

## 2021-09-11 DIAGNOSIS — F209 Schizophrenia, unspecified: Secondary | ICD-10-CM | POA: Diagnosis not present

## 2021-10-02 DIAGNOSIS — F209 Schizophrenia, unspecified: Secondary | ICD-10-CM | POA: Diagnosis not present

## 2021-10-07 DIAGNOSIS — F29 Unspecified psychosis not due to a substance or known physiological condition: Secondary | ICD-10-CM | POA: Diagnosis not present

## 2021-10-07 DIAGNOSIS — F17290 Nicotine dependence, other tobacco product, uncomplicated: Secondary | ICD-10-CM | POA: Diagnosis not present

## 2021-10-07 DIAGNOSIS — E1165 Type 2 diabetes mellitus with hyperglycemia: Secondary | ICD-10-CM | POA: Diagnosis not present

## 2021-10-23 DIAGNOSIS — F209 Schizophrenia, unspecified: Secondary | ICD-10-CM | POA: Diagnosis not present

## 2021-11-04 DIAGNOSIS — F29 Unspecified psychosis not due to a substance or known physiological condition: Secondary | ICD-10-CM | POA: Diagnosis not present

## 2021-11-04 DIAGNOSIS — E1165 Type 2 diabetes mellitus with hyperglycemia: Secondary | ICD-10-CM | POA: Diagnosis not present

## 2021-11-13 DIAGNOSIS — F209 Schizophrenia, unspecified: Secondary | ICD-10-CM | POA: Diagnosis not present

## 2021-12-04 DIAGNOSIS — F209 Schizophrenia, unspecified: Secondary | ICD-10-CM | POA: Diagnosis not present

## 2021-12-05 DIAGNOSIS — E1165 Type 2 diabetes mellitus with hyperglycemia: Secondary | ICD-10-CM | POA: Diagnosis not present

## 2021-12-05 DIAGNOSIS — F29 Unspecified psychosis not due to a substance or known physiological condition: Secondary | ICD-10-CM | POA: Diagnosis not present

## 2021-12-25 DIAGNOSIS — F209 Schizophrenia, unspecified: Secondary | ICD-10-CM | POA: Diagnosis not present

## 2022-01-01 ENCOUNTER — Emergency Department (HOSPITAL_COMMUNITY)
Admission: EM | Admit: 2022-01-01 | Discharge: 2022-01-01 | Disposition: A | Discharge status: Home / Self Care | Payer: Medicare HMO | Attending: Emergency Medicine | Admitting: Emergency Medicine

## 2022-01-01 ENCOUNTER — Emergency Department (HOSPITAL_COMMUNITY): Payer: Medicare HMO

## 2022-01-01 ENCOUNTER — Encounter (HOSPITAL_COMMUNITY): Payer: Self-pay | Admitting: Emergency Medicine

## 2022-01-01 ENCOUNTER — Other Ambulatory Visit: Payer: Self-pay

## 2022-01-01 DIAGNOSIS — I10 Essential (primary) hypertension: Secondary | ICD-10-CM | POA: Diagnosis not present

## 2022-01-01 DIAGNOSIS — Z7984 Long term (current) use of oral hypoglycemic drugs: Secondary | ICD-10-CM | POA: Diagnosis not present

## 2022-01-01 DIAGNOSIS — S92354A Nondisplaced fracture of fifth metatarsal bone, right foot, initial encounter for closed fracture: Secondary | ICD-10-CM | POA: Diagnosis not present

## 2022-01-01 DIAGNOSIS — M79671 Pain in right foot: Secondary | ICD-10-CM | POA: Insufficient documentation

## 2022-01-01 DIAGNOSIS — E1142 Type 2 diabetes mellitus with diabetic polyneuropathy: Secondary | ICD-10-CM | POA: Diagnosis not present

## 2022-01-01 DIAGNOSIS — Y92002 Bathroom of unspecified non-institutional (private) residence single-family (private) house as the place of occurrence of the external cause: Secondary | ICD-10-CM | POA: Insufficient documentation

## 2022-01-01 DIAGNOSIS — W182XXA Fall in (into) shower or empty bathtub, initial encounter: Secondary | ICD-10-CM | POA: Insufficient documentation

## 2022-01-01 DIAGNOSIS — Z79899 Other long term (current) drug therapy: Secondary | ICD-10-CM | POA: Diagnosis not present

## 2022-01-01 DIAGNOSIS — S99921A Unspecified injury of right foot, initial encounter: Secondary | ICD-10-CM | POA: Diagnosis present

## 2022-01-01 DIAGNOSIS — M25571 Pain in right ankle and joints of right foot: Secondary | ICD-10-CM | POA: Diagnosis not present

## 2022-01-01 MED ORDER — HYDROCODONE-ACETAMINOPHEN 5-325 MG PO TABS: 1.0000 | ORAL_TABLET | Freq: Four times a day (QID) | ORAL | 0 refills | Status: DC | PRN

## 2022-01-01 NOTE — ED Triage Notes (Signed)
Stepping out of bathroom, pt injured right ankle on yesterday.

## 2022-01-01 NOTE — Discharge Instructions (Signed)
You were diagnosed today with a fracture of the fifth metatarsal in your right foot.  This has been splinted.  You should wear the splint at all times.  Keep this extremity nonweightbearing by using the crutches provided.  You should ice and elevate the foot once home.  I provided orthopedic surgery information for follow-up in 3 to 5 days.

## 2022-01-01 NOTE — ED Provider Notes (Signed)
[facility] EMERGENCY DEPARTMENT Provider Note   CSN: [id number] Arrival date & time: 01/01/22  1232     History  Chief Complaint  Patient presents with   Ankle Pain    Regina Lambert is a 71 y.o. female.  The patient presents to the hospital complaining of right foot/ankle pain.  Patient states that she fell when getting out of her bathtub yesterday and is unsure of how she landed but immediately had pain in the right foot.  The patient does have history of previous podiatry intervention in the right foot at the toes.  Patient is ambulatory and weightbearing at this time.  She denies hitting her head, denies loss of consciousness.  Other past medical history significant for diabetes, hypertension, polyneuropathy, hyperlipidemia  HPI     Home Medications Prior to Admission medications   Medication Sig Start Date End Date Taking? Authorizing Provider  HYDROcodone-acetaminophen (NORCO/VICODIN) 5-325 MG tablet Take 1 tablet by mouth every 6 (six) hours as needed. 01/01/22  Yes Dorothyann Peng, PA-C  acetaminophen (TYLENOL) 325 MG tablet Take 650 mg by mouth every 6 (six) hours as needed for moderate pain.    [provider]  albuterol (PROVENTIL HFA;VENTOLIN HFA) 108 (90 BASE) MCG/ACT inhaler Inhale 2 puffs into the lungs every 6 (six) hours as needed for wheezing or shortness of breath.    [provider]  benztropine (COGENTIN) 0.5 MG tablet Take 0.5 mg by mouth 2 (two) times daily.    [provider]  haloperidol (HALDOL) 5 MG tablet Take 5 mg by mouth 2 (two) times daily.    [provider]  haloperidol decanoate (HALDOL DECANOATE) 100 MG/ML injection Inject 50 mg as directed every 21 ( twenty-one) days.  09/05/15   [provider]  metFORMIN (GLUCOPHAGE) 500 MG tablet Take 500 mg by mouth 2 (two) times daily with a meal.     [provider]  metoprolol tartrate (LOPRESSOR) 25 MG tablet  08/14/20   [provider]   simvastatin (ZOCOR) 20 MG tablet  08/14/20   [provider]  TRADJENTA 5 MG TABS tablet Take 1 tablet by mouth daily. 09/11/15   [provider]      Allergies    Penicillins    Review of Systems   Review of Systems  Musculoskeletal:  Positive for arthralgias. Negative for joint swelling.   Physical Exam Updated Vital Signs BP 125/76 (BP Location: Right Arm)   Pulse 80   Temp 98 F (36.7 C) (Oral)   Resp 16   Ht '5\' 7"'$  (1.702 m)   Wt 62.6 kg   SpO2 100%   BMI 21.61 kg/m  Physical Exam Vitals and nursing note reviewed.  Constitutional:      General: She is not in acute distress.    Appearance: She is normal weight.  HENT:     Head: Normocephalic and atraumatic.  Eyes:     Conjunctiva/sclera: Conjunctivae normal.  Cardiovascular:     Rate and Rhythm: Normal rate.  Pulmonary:     Effort: Pulmonary effort is normal.  Musculoskeletal:        General: Tenderness and signs of injury present. No swelling or deformity.     Comments: Pain with palpation over fifth metatarsal of right foot at the proximal end of the shaft  Neurological:     Mental Status: She is alert.    ED Results / Procedures / Treatments   Labs (all labs ordered are listed, but only abnormal  results are displayed) Labs Reviewed - No data to display  EKG None  Radiology DG Ankle Complete Right  Result Date: 01/01/2022 CLINICAL DATA:  Right ankle pain and swelling after fall yesterday. EXAM: RIGHT ANKLE - COMPLETE 3+ VIEW COMPARISON:  None Available. FINDINGS: Nondisplaced proximal fifth metatarsal fracture is noted. No joint space abnormality is noted. No other bony abnormality is noted. IMPRESSION: Nondisplaced proximal fifth metatarsal fracture. Electronically Signed   By: Marijo Conception M.D.   On: 01/01/2022 13:36    Procedures .Ortho Injury Treatment  Date/Time: 01/01/2022 2:04 PM Performed by: Dorothyann Peng, PA-C Authorized by: Dorothyann Peng, PA-C   Consent:     Consent obtained:  Verbal   Consent given by:  Patient   Risks discussed:  Nerve damage, restricted joint movement, vascular damage and stiffness   Alternatives discussed:  No treatmentInjury location: foot Location details: right foot Injury type: fracture Fracture type: fifth metatarsal Pre-procedure neurovascular assessment: neurovascularly intact Immobilization: splint Splint type: Posterior leg. Splint Applied by: ED Tech Post-procedure neurovascular assessment: post-procedure neurovascularly intact      Medications Ordered in ED Medications - No data to display  ED Course/ Medical Decision Making/ A&P                           Medical Decision Making Amount and/or Complexity of Data Reviewed Radiology: ordered.   The patient presents with right foot pain secondary to a fall.  Differential includes but is not limited to fracture, dislocation, soft tissue injury, and others  I ordered and personally interpreted imaging including plain radiographs of the right ankle.  Nondisplaced proximal fifth metatarsal fracture.  I agree with the radiologist findings.  I had the injury splinted as noted above.  The patient benign weightbearing on the right lower extremity.  She should follow-up with orthopedics in 3 to 5 days. I did provide a short course of Norco for pain. There is no indication for admission or emergent surgical referral.  Discharge home   Final Clinical Impression(s) / ED Diagnoses Final diagnoses:  Closed nondisplaced fracture of fifth metatarsal bone of right foot, initial encounter    Rx / DC Orders ED Discharge Orders          Ordered    HYDROcodone-acetaminophen (NORCO/VICODIN) 5-325 MG tablet  Every 6 hours PRN        01/01/22 1441              Ronny Bacon 01/01/22 1442    Wyvonnia Dusky, MD 01/01/22 [phone]

## 2022-01-02 DIAGNOSIS — M25571 Pain in right ankle and joints of right foot: Secondary | ICD-10-CM | POA: Diagnosis not present

## 2022-01-04 DIAGNOSIS — F29 Unspecified psychosis not due to a substance or known physiological condition: Secondary | ICD-10-CM | POA: Diagnosis not present

## 2022-01-04 DIAGNOSIS — E1165 Type 2 diabetes mellitus with hyperglycemia: Secondary | ICD-10-CM | POA: Diagnosis not present

## 2022-01-06 ENCOUNTER — Encounter (HOSPITAL_BASED_OUTPATIENT_CLINIC_OR_DEPARTMENT_OTHER): Payer: Self-pay | Admitting: Orthopedic Surgery

## 2022-01-06 ENCOUNTER — Other Ambulatory Visit: Payer: Self-pay

## 2022-01-09 ENCOUNTER — Encounter (HOSPITAL_BASED_OUTPATIENT_CLINIC_OR_DEPARTMENT_OTHER): Payer: Self-pay | Admitting: Orthopedic Surgery

## 2022-01-09 ENCOUNTER — Other Ambulatory Visit: Payer: Self-pay

## 2022-01-09 ENCOUNTER — Ambulatory Visit (HOSPITAL_BASED_OUTPATIENT_CLINIC_OR_DEPARTMENT_OTHER): Payer: Medicare HMO | Admitting: Certified Registered"

## 2022-01-09 ENCOUNTER — Encounter (HOSPITAL_BASED_OUTPATIENT_CLINIC_OR_DEPARTMENT_OTHER)
Admission: RE | Disposition: A | Discharge status: Home / Self Care | Payer: Self-pay | Source: Home / Self Care | Attending: Orthopedic Surgery

## 2022-01-09 ENCOUNTER — Ambulatory Visit (HOSPITAL_BASED_OUTPATIENT_CLINIC_OR_DEPARTMENT_OTHER): Payer: Medicare HMO

## 2022-01-09 ENCOUNTER — Ambulatory Visit (HOSPITAL_BASED_OUTPATIENT_CLINIC_OR_DEPARTMENT_OTHER)
Admission: RE | Admit: 2022-01-09 | Discharge: 2022-01-09 | Disposition: A | Discharge status: Home / Self Care | Payer: Medicare HMO | Attending: Orthopedic Surgery | Admitting: Orthopedic Surgery

## 2022-01-09 DIAGNOSIS — F17210 Nicotine dependence, cigarettes, uncomplicated: Secondary | ICD-10-CM | POA: Insufficient documentation

## 2022-01-09 DIAGNOSIS — E119 Type 2 diabetes mellitus without complications: Secondary | ICD-10-CM | POA: Diagnosis not present

## 2022-01-09 DIAGNOSIS — I10 Essential (primary) hypertension: Secondary | ICD-10-CM | POA: Diagnosis not present

## 2022-01-09 DIAGNOSIS — X501XXA Overexertion from prolonged static or awkward postures, initial encounter: Secondary | ICD-10-CM | POA: Insufficient documentation

## 2022-01-09 DIAGNOSIS — S92351A Displaced fracture of fifth metatarsal bone, right foot, initial encounter for closed fracture: Secondary | ICD-10-CM | POA: Insufficient documentation

## 2022-01-09 DIAGNOSIS — G8918 Other acute postprocedural pain: Secondary | ICD-10-CM | POA: Diagnosis not present

## 2022-01-09 DIAGNOSIS — Z7984 Long term (current) use of oral hypoglycemic drugs: Secondary | ICD-10-CM | POA: Diagnosis not present

## 2022-01-09 HISTORY — PX: ORIF TOE FRACTURE: SHX5032

## 2022-01-09 HISTORY — DX: Schizophrenia, unspecified: F20.9

## 2022-01-09 HISTORY — DX: Displaced fracture of fifth metatarsal bone, unspecified foot, initial encounter for closed fracture: S92.353A

## 2022-01-09 LAB — BASIC METABOLIC PANEL
Anion gap: 8 (ref 5–15)
BUN: 8 mg/dL (ref 8–23)
CO2: 26 mmol/L (ref 22–32)
Calcium: 9.4 mg/dL (ref 8.9–10.3)
Chloride: 101 mmol/L (ref 98–111)
Creatinine, Ser: 0.72 mg/dL (ref 0.44–1.00)
GFR, Estimated: >60 mL/min (ref >60)
Glucose, Bld: 146 mg/dL — ABNORMAL HIGH (ref 70–99)
Potassium: 4.5 mmol/L (ref 3.5–5.1)
Sodium: 135 mmol/L (ref 135–145)

## 2022-01-09 LAB — GLUCOSE, CAPILLARY
Glucose-Capillary: 147 mg/dL — ABNORMAL HIGH (ref 70–99)
Glucose-Capillary: 152 mg/dL — ABNORMAL HIGH (ref 70–99)

## 2022-01-09 SURGERY — OPEN REDUCTION INTERNAL FIXATION (ORIF) METATARSAL (TOE) FRACTURE
Anesthesia: General | Site: Toe | Laterality: Right

## 2022-01-09 MED ORDER — LIDOCAINE HCL (CARDIAC) PF 100 MG/5ML IV SOSY
PREFILLED_SYRINGE | INTRAVENOUS | Status: DC | PRN
  Administered 2022-01-09: 30 mg via INTRAVENOUS

## 2022-01-09 MED ORDER — ACETAMINOPHEN 500 MG PO TABS
1000.0000 mg | ORAL_TABLET | Freq: Once | ORAL | Status: AC
  Administered 2022-01-09: 1000 mg via ORAL

## 2022-01-09 MED ORDER — CEFAZOLIN SODIUM-DEXTROSE 2-4 GM/100ML-% IV SOLN: 2.0000 g | INTRAVENOUS | Status: DC

## 2022-01-09 MED ORDER — 0.9 % SODIUM CHLORIDE (POUR BTL) OPTIME
TOPICAL | Status: DC | PRN
  Administered 2022-01-09: 300 mL

## 2022-01-09 MED ORDER — BUPIVACAINE HCL (PF) 0.5 % IJ SOLN
INTRAMUSCULAR | Status: AC
  Filled 2022-01-09: qty 30

## 2022-01-09 MED ORDER — PHENYLEPHRINE HCL (PRESSORS) 10 MG/ML IV SOLN
INTRAVENOUS | Status: DC | PRN
  Administered 2022-01-09: 80 ug via INTRAVENOUS
  Administered 2022-01-09: 40 ug via INTRAVENOUS

## 2022-01-09 MED ORDER — MIDAZOLAM HCL 2 MG/2ML IJ SOLN
INTRAMUSCULAR | Status: AC
  Filled 2022-01-09: qty 2

## 2022-01-09 MED ORDER — ONDANSETRON HCL 4 MG PO TABS: 4.0000 mg | ORAL_TABLET | Freq: Three times a day (TID) | ORAL | 0 refills | Status: AC | PRN

## 2022-01-09 MED ORDER — BUPIVACAINE-EPINEPHRINE (PF) 0.5% -1:200000 IJ SOLN
INTRAMUSCULAR | Status: DC | PRN
  Administered 2022-01-09: 25 mL via PERINEURAL

## 2022-01-09 MED ORDER — MIDAZOLAM HCL 2 MG/2ML IJ SOLN: 2.0000 mg | Freq: Once | INTRAMUSCULAR | Status: DC

## 2022-01-09 MED ORDER — ONDANSETRON HCL 4 MG/2ML IJ SOLN
INTRAMUSCULAR | Status: DC | PRN
  Administered 2022-01-09: 4 mg via INTRAVENOUS

## 2022-01-09 MED ORDER — ACETAMINOPHEN 500 MG PO TABS
ORAL_TABLET | ORAL | Status: AC
  Filled 2022-01-09: qty 2

## 2022-01-09 MED ORDER — LACTATED RINGERS IV SOLN: INTRAVENOUS | Status: DC

## 2022-01-09 MED ORDER — CEFAZOLIN SODIUM-DEXTROSE 2-4 GM/100ML-% IV SOLN
INTRAVENOUS | Status: AC
  Filled 2022-01-09: qty 100

## 2022-01-09 MED ORDER — PROPOFOL 10 MG/ML IV BOLUS
INTRAVENOUS | Status: DC | PRN
  Administered 2022-01-09: 100 mg via INTRAVENOUS

## 2022-01-09 MED ORDER — ASPIRIN 81 MG PO TBEC: 81.0000 mg | DELAYED_RELEASE_TABLET | Freq: Two times a day (BID) | ORAL | 0 refills | Status: AC

## 2022-01-09 MED ORDER — FENTANYL CITRATE (PF) 100 MCG/2ML IJ SOLN: 25.0000 ug | INTRAMUSCULAR | Status: DC | PRN

## 2022-01-09 MED ORDER — PROPOFOL 500 MG/50ML IV EMUL
INTRAVENOUS | Status: DC | PRN
  Administered 2022-01-09: 125 ug/kg/min via INTRAVENOUS

## 2022-01-09 MED ORDER — FENTANYL CITRATE (PF) 100 MCG/2ML IJ SOLN
100.0000 ug | Freq: Once | INTRAMUSCULAR | Status: AC
  Administered 2022-01-09: 50 ug via INTRAVENOUS

## 2022-01-09 MED ORDER — ACETAMINOPHEN 500 MG PO TABS: 1000.0000 mg | ORAL_TABLET | Freq: Three times a day (TID) | ORAL | 0 refills | Status: AC | PRN

## 2022-01-09 MED ORDER — FENTANYL CITRATE (PF) 100 MCG/2ML IJ SOLN
INTRAMUSCULAR | Status: AC
  Filled 2022-01-09: qty 2

## 2022-01-09 MED ORDER — HYDROCODONE-ACETAMINOPHEN 5-325 MG PO TABS: 1.0000 | ORAL_TABLET | ORAL | 0 refills | Status: AC | PRN

## 2022-01-09 SURGICAL SUPPLY — 67 items
APL PRP STRL LF DISP 70% ISPRP (MISCELLANEOUS) ×1
BANDAGE ESMARK 6X9 LF (GAUZE/BANDAGES/DRESSINGS) IMPLANT
BIT DRILL CANN 3.5 (BIT) ×2
BIT DRILL SRG 3.5XCAN FFTH MTR (BIT) IMPLANT
BIT DRL SRG 3.5XCANN FIFTH MTR (BIT) ×1
BLADE SURG 15 STRL LF DISP TIS (BLADE) ×2 IMPLANT
BLADE SURG 15 STRL SS (BLADE) ×4
BNDG CMPR 9X6 STRL LF SNTH (GAUZE/BANDAGES/DRESSINGS)
BNDG COHESIVE 4X5 TAN ST LF (GAUZE/BANDAGES/DRESSINGS) ×2 IMPLANT
BNDG ELASTIC 4X5.8 VLCR STR LF (GAUZE/BANDAGES/DRESSINGS) ×2 IMPLANT
BNDG ELASTIC 6X5.8 VLCR STR LF (GAUZE/BANDAGES/DRESSINGS) ×2 IMPLANT
BNDG ESMARK 6X9 LF (GAUZE/BANDAGES/DRESSINGS)
CHLORAPREP W/TINT 26 (MISCELLANEOUS) ×2 IMPLANT
COVER BACK TABLE 60X90IN (DRAPES) ×2 IMPLANT
CUFF TOURN SGL QUICK 24 (TOURNIQUET CUFF)
CUFF TOURN SGL QUICK 34 (TOURNIQUET CUFF)
CUFF TRNQT CYL 24X4X16.5-23 (TOURNIQUET CUFF) IMPLANT
CUFF TRNQT CYL 34X4.125X (TOURNIQUET CUFF) IMPLANT
DRAPE C-ARM 35X43 STRL (DRAPES) ×2 IMPLANT
DRAPE C-ARMOR (DRAPES) ×2 IMPLANT
DRAPE EXTREMITY T 121X128X90 (DISPOSABLE) ×2 IMPLANT
DRAPE IMP U-DRAPE 54X76 (DRAPES) ×2 IMPLANT
DRAPE U-SHAPE 47X51 STRL (DRAPES) ×2 IMPLANT
DRSG ADAPTIC 3X8 NADH LF (GAUZE/BANDAGES/DRESSINGS) ×2 IMPLANT
DRSG PAD ABDOMINAL 8X10 ST (GAUZE/BANDAGES/DRESSINGS) ×2 IMPLANT
ELECT REM PT RETURN 9FT ADLT (ELECTROSURGICAL) ×2
ELECTRODE REM PT RTRN 9FT ADLT (ELECTROSURGICAL) ×1 IMPLANT
GAUZE SPONGE 4X4 12PLY STRL (GAUZE/BANDAGES/DRESSINGS) ×2 IMPLANT
GLOVE BIO SURGEON STRL SZ8 (GLOVE) ×2 IMPLANT
GLOVE BIO SURGEON STRL SZ8.5 (GLOVE) ×2 IMPLANT
GLOVE BIOGEL PI IND STRL 8 (GLOVE) ×1 IMPLANT
GLOVE BIOGEL PI INDICATOR 8 (GLOVE) ×1
GLOVE SURG ORTHO 8.5 STRL (GLOVE) ×2 IMPLANT
GOWN STRL REUS W/ TWL LRG LVL3 (GOWN DISPOSABLE) ×1 IMPLANT
GOWN STRL REUS W/TWL LRG LVL3 (GOWN DISPOSABLE) ×2
GOWN STRL REUS W/TWL XL LVL3 (GOWN DISPOSABLE) ×2 IMPLANT
GUIDEWIRE .07X8 (WIRE) ×1 IMPLANT
NEEDLE HYPO 22GX1.5 SAFETY (NEEDLE) IMPLANT
NS IRRIG 1000ML POUR BTL (IV SOLUTION) ×2 IMPLANT
PACK BASIN DAY SURGERY FS (CUSTOM PROCEDURE TRAY) ×2 IMPLANT
PAD CAST 4YDX4 CTTN HI CHSV (CAST SUPPLIES) ×1 IMPLANT
PADDING CAST ABS 4INX4YD NS (CAST SUPPLIES) ×2
PADDING CAST ABS COTTON 4X4 ST (CAST SUPPLIES) ×2 IMPLANT
PADDING CAST COTTON 4X4 STRL (CAST SUPPLIES) ×2
PADDING CAST COTTON 6X4 STRL (CAST SUPPLIES) ×2 IMPLANT
PENCIL SMOKE EVACUATOR (MISCELLANEOUS) ×2 IMPLANT
SCREW JONES 6.0X50 (Screw) ×1 IMPLANT
SLEEVE SCD COMPRESS KNEE MED (STOCKING) ×2 IMPLANT
SPIKE FLUID TRANSFER (MISCELLANEOUS) IMPLANT
SPLINT FAST PLASTER 5X30 (CAST SUPPLIES) ×10
SPLINT PLASTER CAST FAST 5X30 (CAST SUPPLIES) ×10 IMPLANT
SPONGE T-LAP 18X18 ~~LOC~~+RFID (SPONGE) ×2 IMPLANT
SUCTION FRAZIER HANDLE 10FR (MISCELLANEOUS) ×2
SUCTION TUBE FRAZIER 10FR DISP (MISCELLANEOUS) ×1 IMPLANT
SUT ETHILON 3 0 PS 1 (SUTURE) ×2 IMPLANT
SUT VIC AB 0 CT1 27 (SUTURE) ×2
SUT VIC AB 0 CT1 27XBRD ANBCTR (SUTURE) ×1 IMPLANT
SUT VIC AB 2-0 SH 27 (SUTURE) ×2
SUT VIC AB 2-0 SH 27XBRD (SUTURE) ×1 IMPLANT
SYR BULB EAR ULCER 3OZ GRN STR (SYRINGE) ×2 IMPLANT
SYR CONTROL 10ML LL (SYRINGE) IMPLANT
TAP BONE 6.0 (TAP) ×1 IMPLANT
TAP BONE CANN 4.5MM (BIT) ×1 IMPLANT
TAP CANN 5.5MM (BIT) ×1 IMPLANT
TOWEL GREEN STERILE FF (TOWEL DISPOSABLE) ×4 IMPLANT
TUBE CONNECTING 20X1/4 (TUBING) ×2 IMPLANT
UNDERPAD 30X36 HEAVY ABSORB (UNDERPADS AND DIAPERS) ×2 IMPLANT

## 2022-01-09 NOTE — Anesthesia Postprocedure Evaluation (Signed)
Anesthesia Post Note  Patient: Tilden Dome  Procedure(s) Performed: OPEN REDUCTION INTERNAL FIXATION (ORIF) METATARSAL (TOE) FRACTURE; RIGHT 5TH (Right: Toe)     Patient location during evaluation: PACU Anesthesia Type: General and Regional Level of consciousness: awake and alert Pain management: pain level controlled Vital Signs Assessment: post-procedure vital signs reviewed and stable Respiratory status: spontaneous breathing, nonlabored ventilation and respiratory function stable Cardiovascular status: blood pressure returned to baseline and stable Postop Assessment: no apparent nausea or vomiting Anesthetic complications: no   No notable events documented.  Last Vitals:  Vitals:   01/09/22 0945 01/09/22 1013  BP: 99/64 (!) 121/92  Pulse: 65 77  Resp: 12 18  Temp:  36.4 C  SpO2: 100% 97%    Last Pain:  Vitals:   01/09/22 1013  TempSrc: Oral  PainSc: 0-No pain        RLE Motor Response: No movement due to regional block (01/09/22 1015) RLE Sensation: No sensation (absent);No pain;No tingling (01/09/22 1015)      Jex Strausbaugh,W. EDMOND

## 2022-01-09 NOTE — Anesthesia Preprocedure Evaluation (Addendum)
Anesthesia Evaluation  Patient identified by MRN, date of birth, ID band Patient awake    Reviewed: Allergy & Precautions, H&P , NPO status , Patient's Chart, lab work & pertinent test results, reviewed documented beta blocker date and time   Airway Mallampati: II  TM Distance: >3 FB Neck ROM: Full    Dental no notable dental hx. (+) Upper Dentures, Edentulous Lower, Dental Advisory Given   Pulmonary Current Smoker and Patient abstained from smoking.,    Pulmonary exam normal breath sounds clear to auscultation       Cardiovascular hypertension, Pt. on medications and Pt. on home beta blockers  Rhythm:Regular Rate:Normal     Neuro/Psych negative neurological ROS  negative psych ROS   GI/Hepatic negative GI ROS, Neg liver ROS,   Endo/Other  diabetes, Type 2, Oral Hypoglycemic Agents  Renal/GU negative Renal ROS  negative genitourinary   Musculoskeletal   Abdominal   Peds  Hematology negative hematology ROS (+)   Anesthesia Other Findings   Reproductive/Obstetrics negative OB ROS                            Anesthesia Physical Anesthesia Plan  ASA: 2  Anesthesia Plan: General   Post-op Pain Management: Regional block* and Tylenol PO (pre-op)*   Induction: Intravenous  PONV Risk Score and Plan: 2 and Propofol infusion, Ondansetron and TIVA  Airway Management Planned: LMA  Additional Equipment:   Intra-op Plan:   Post-operative Plan: Extubation in OR  Informed Consent: I have reviewed the patients History and Physical, chart, labs and discussed the procedure including the risks, benefits and alternatives for the proposed anesthesia with the patient or authorized representative who has indicated his/her understanding and acceptance.     Dental advisory given  Plan Discussed with: CRNA  Anesthesia Plan Comments:         Anesthesia Quick Evaluation

## 2022-01-09 NOTE — Transfer of Care (Signed)
Immediate Anesthesia Transfer of Care Note  Patient: Regina Lambert  Procedure(s) Performed: OPEN REDUCTION INTERNAL FIXATION (ORIF) METATARSAL (TOE) FRACTURE; RIGHT 5TH (Right: Toe)  Patient Location: PACU  Anesthesia Type:GA combined with regional for post-op pain  Level of Consciousness: drowsy and patient cooperative  Airway & Oxygen Therapy: Patient Spontanous Breathing and Patient connected to face mask oxygen  Post-op Assessment: Report given to RN and Post -op Vital signs reviewed and stable  Post vital signs: Reviewed and stable  Last Vitals:  Vitals Value Taken Time  BP    Temp    Pulse 69 01/09/22 0934  Resp    SpO2 95 % 01/09/22 0934  Vitals shown include unvalidated device data.  Last Pain:  Vitals:   01/09/22 0720  TempSrc: Oral  PainSc: 0-No pain      Patients Stated Pain Goal: 3 (41/96/22 2979)  Complications: No notable events documented.

## 2022-01-09 NOTE — H&P (Signed)
Regina Lambert is an 71 y.o. female.   Chief Complaint: Right lateral foot injury HPI: Regina Lambert is a pleasant 71 year old female who inverted her ankle and foot stepping out of the shower.  She has significant pain to the lateral midfoot.  She was seen in the ER where x-rays demonstrated 1/5 metatarsal fracture.  She was immobilized in a splint.  The injury occurred approximately 1 week ago on 01/01/2022.  She denies pain other joints or extremities she denies distal numbness and tingling.  Past Medical History:  Diagnosis Date   Diabetes ([place])    Fracture of 5th metatarsal    right foot   Hyperlipidemia    Hypertension    Polyneuropathy    Poor historian    Psychotic disorder ([place])    schizophrenia   Schizophrenic disorder ([place])     Past Surgical History:  Procedure Laterality Date   ABDOMINAL HYSTERECTOMY     BLADDER SURGERY     COLONOSCOPY WITH PROPOFOL N/A 10/07/2015   Procedure: COLONOSCOPY WITH PROPOFOL;  Surgeon: Daneil Dolin, MD;  [facility]: [facility];  Service: Endoscopy;  Laterality: N/A;  1030 - moved to 11:15 - office to notify   POLYPECTOMY  10/07/2015   Procedure: POLYPECTOMY;  Surgeon: Daneil Dolin, MD;  Location: [facility];  Service: Endoscopy;;  hepatic flexsure   TUBAL LIGATION      Family History  Problem Relation Age of Onset   Colon cancer Neg Hx    Social History:  reports that she has been smoking cigarettes. She has a 9.00 pack-year smoking history. She has never used smokeless tobacco. She reports that she does not drink alcohol and does not use drugs.  Allergies:  Allergies  Allergen Reactions   Penicillins Swelling    Medications Prior to Admission  Medication Sig Dispense Refill   ALPRAZolam (XANAX) 0.5 MG tablet Take 0.5 mg by mouth daily.     amLODipine (NORVASC) 5 MG tablet Take 5 mg by mouth daily.     benztropine (COGENTIN) 0.5 MG tablet Take 0.5 mg by mouth 2 (two) times daily.     haloperidol (HALDOL) 5 MG tablet Take 5 mg by  mouth 2 (two) times daily.     haloperidol decanoate (HALDOL DECANOATE) 100 MG/ML injection Inject 50 mg as directed every 21 ( twenty-one) days.      metFORMIN (GLUCOPHAGE) 500 MG tablet Take 500 mg by mouth 2 (two) times daily with a meal.      metoprolol tartrate (LOPRESSOR) 25 MG tablet      simvastatin (ZOCOR) 20 MG tablet      acetaminophen (TYLENOL) 325 MG tablet Take 650 mg by mouth every 6 (six) hours as needed for moderate pain.     albuterol (PROVENTIL HFA;VENTOLIN HFA) 108 (90 BASE) MCG/ACT inhaler Inhale 2 puffs into the lungs every 6 (six) hours as needed for wheezing or shortness of breath.     HYDROcodone-acetaminophen (NORCO/VICODIN) 5-325 MG tablet Take 1 tablet by mouth every 6 (six) hours as needed. 12 tablet 0    Results for orders placed or performed during the hospital encounter of 01/09/22 (from the past 48 hour(s))  Glucose, capillary     Status: Abnormal   Collection Time: 01/09/22  7:28 AM  Result Value Ref Range   Glucose-Capillary 147 (H) 70 - 99 mg/dL    Comment: Glucose reference range applies only to samples taken after fasting for at least 8 hours.  Basic metabolic panel per protocol  Status: Abnormal   Collection Time: 01/09/22  7:41 AM  Result Value Ref Range   Sodium 135 135 - 145 mmol/L   Potassium 4.5 3.5 - 5.1 mmol/L   Chloride 101 98 - 111 mmol/L   CO2 26 22 - 32 mmol/L   Glucose, Bld 146 (H) 70 - 99 mg/dL    Comment: Glucose reference range applies only to samples taken after fasting for at least 8 hours.   BUN 8 8 - 23 mg/dL   Creatinine, Ser 0.72 0.44 - 1.00 mg/dL   Calcium 9.4 8.9 - 10.3 mg/dL   GFR, Estimated >60 >60 mL/min    Comment: (NOTE) Calculated using the CKD-EPI Creatinine Equation (2021)    Anion gap 8 5 - 15    Comment: Performed at [facility] [address], [place], [place] [address]   DG MINI C-ARM IMAGE ONLY  Result Date: 01/09/2022 There is no interpretation for this exam.  This order is for images obtained  during a surgical procedure.  Please See "Surgeries" Tab for more information regarding the procedure.    Review of Systems  Blood pressure 134/76, pulse 74, temperature 98.1 F (36.7 C), temperature source Oral, resp. rate 14, height '5\' 7"'$  (1.702 m), weight 60.8 kg, SpO2 97 %. Physical Exam . Right lower extremity: Focal tenderness to the fifth metatarsal base.  Full painless ankle range of motion.  Mild swelling about the lateral midfoot.  Intact sensation distally.  Foot is warm and well-perfused.  X-rays right foot from clinic demonstrate a minimally displaced transverse zone 2 fifth metatarsal base fracture   Assessment/Plan Right fifth metatarsal base fracture Jones fracture.  I discussed with the patient daughter in clinic that the location of the fracture is through the watershed area and has an increased risk of nonunion and malunion with nonoperative treatment.  Nonunion right reported as high as 15 to 30%.  Discussed operative fixation with an intramedullary screw to help facilitate union.  Risks of surgery were discussed including infection nerve injury, hardware prominence, bleeding, risk for secondary surgery, and risks of anesthesia.  Patient and daughter are agreeable to proceeding with surgery to avoid the risk of nonunion.  Patient is also a smoker and discussed with her to refrain or cut back from nicotine use is much as possible in the perioperative period.  Right lower extremity was marked.  Consent was signed.  Willaim Sheng, MD 01/09/2022, 8:20 AM

## 2022-01-09 NOTE — Discharge Instructions (Addendum)
Diet: As you were doing prior to hospitalization   Shower:   If you have a splint on, leave the splint in place and keep the splint dry with a plastic bag.  Dressing:  If you have a splint, then just leave the splint in place and we will change your bandages during your first follow-up appointment.  If water gets in the splint or the splint gets saturated please call the clinic and we can see you to change your splint.   Activity:  Increase activity slowly as tolerated, but follow the weight bearing instructions below.  The rules on driving is that you can not be taking narcotics while you drive, and you must feel in control of the vehicle.    Weight Bearing:   nonweightbearing right lower extremity in the splint.    Blood clot prevention (DVT Prophylaxis): After surgery you are at an increased risk for a blood clot. you were prescribed a blood thinner, aspirin '81mg'$ , to be taken twice daily for a total of 4 weeks from surgery to help reduce your risk of getting a blood clot. This will help prevent a blood clot. Signs of a pulmonary embolus (blood clot in the lungs) include sudden short of breath, feeling lightheaded or dizzy, chest pain with a deep breath, rapid pulse rapid breathing. Signs of a blood clot in your arms or legs include new unexplained swelling and cramping, warm, red or darkened skin around the painful area. Please call the office or 911 right away if these signs or symptoms develop. To prevent constipation: you may use a stool softener such as -  Colace (over the counter) 100 mg by mouth twice a day  Drink plenty of fluids (prune juice may be helpful) and high fiber foods Miralax (over the counter) for constipation as needed.    Itching:  If you experience itching with your medications, try taking only a single pain pill, or even half a pain pill at a time.  You may take up to 10 pain pills per day, and you can also use benadryl over the counter for itching or also to help with  sleep.   Precautions:  If you experience chest pain or shortness of breath - call 911 immediately for transfer to the hospital emergency department!!   Call office [phone]) for the following: Temperature greater than 101F Persistent nausea and vomiting Severe uncontrolled pain Redness, tenderness, or signs of infection (pain, swelling, redness, odor or green/yellow discharge around the site) Difficulty breathing, headache or visual disturbances Hives Persistent dizziness or light-headedness Extreme fatigue Any other questions or concerns you may have after discharge  In an emergency, call 911 or go to an Emergency Department at a nearby hospital  Follow- Up Appointment:  Please call for an appointment to be seen approximately 2-3 week after surgery in [facility] with your surgeon Dr. Charlies Constable - [phone] Address: [address], [place], [place] [address]    No Tylenol before 1:30pm 01/09/22  Post Anesthesia Home Care Instructions  Activity: Get plenty of rest for the remainder of the day. A responsible individual must stay with you for 24 hours following the procedure.  For the next 24 hours, DO NOT: -Drive a car -Paediatric nurse -Drink alcoholic beverages -Take any medication unless instructed by your physician -Make any legal decisions or sign important papers.  Meals: Start with liquid foods such as gelatin or soup. Progress to regular foods as tolerated. Avoid greasy, spicy, heavy foods. If nausea and/or  vomiting occur, drink only clear liquids until the nausea and/or vomiting subsides. Call your physician if vomiting continues.  Special Instructions/Symptoms: Your throat may feel dry or sore from the anesthesia or the breathing tube placed in your throat during surgery. If this causes discomfort, gargle with warm salt water. The discomfort should disappear within 24 hours.  If you had a scopolamine patch placed behind your ear for the management  of post- operative nausea and/or vomiting:  1. The medication in the patch is effective for 72 hours, after which it should be removed.  Wrap patch in a tissue and discard in the trash. Wash hands thoroughly with soap and water. 2. You may remove the patch earlier than 72 hours if you experience unpleasant side effects which may include dry mouth, dizziness or visual disturbances. 3. Avoid touching the patch. Wash your hands with soap and water after contact with the patch.    Regional Anesthesia Blocks  1. Numbness or the inability to move the "blocked" extremity may last from 3-48 hours after placement. The length of time depends on the medication injected and your individual response to the medication. If the numbness is not going away after 48 hours, call your surgeon.  2. The extremity that is blocked will need to be protected until the numbness is gone and the  Strength has returned. Because you cannot feel it, you will need to take extra care to avoid injury. Because it may be weak, you may have difficulty moving it or using it. You may not know what position it is in without looking at it while the block is in effect.  3. For blocks in the legs and feet, returning to weight bearing and walking needs to be done carefully. You will need to wait until the numbness is entirely gone and the strength has returned. You should be able to move your leg and foot normally before you try and bear weight or walk. You will need someone to be with you when you first try to ensure you do not fall and possibly risk injury.  4. Bruising and tenderness at the needle site are common side effects and will resolve in a few days.  5. Persistent numbness or new problems with movement should be communicated to the surgeon or the [facility] [phone] [place] [phone]).

## 2022-01-09 NOTE — Anesthesia Procedure Notes (Signed)
Anesthesia Regional Block: Popliteal block   Pre-Anesthetic Checklist: , timeout performed,  Correct Patient, Correct Site, Correct Laterality,  Correct Procedure, Correct Position, site marked,  Risks and benefits discussed,  Pre-op evaluation,  At surgeon's request and post-op pain management  Laterality: Right  Prep: Maximum Sterile Barrier Precautions used, chloraprep       Needles:  Injection technique: Single-shot  Needle Type: Echogenic Stimulator Needle     Needle Length: 9cm  Needle Gauge: 21     Additional Needles:   Procedures:,,,, ultrasound used (permanent image in chart),,    Narrative:  Start time: 01/09/2022 7:47 AM End time: 01/09/2022 7:57 AM Injection made incrementally with aspirations every 5 mL.  Performed by: Personally  Anesthesiologist: Roderic Palau, MD

## 2022-01-09 NOTE — Op Note (Signed)
01/09/2022  9:39 AM  PATIENT:  Regina Lambert    PRE-OPERATIVE DIAGNOSIS:  RIGHT 5TH METATARSHAL FRACTURE, Jones fracture  POST-OPERATIVE DIAGNOSIS:  Same  PROCEDURE:  OPEN REDUCTION INTERNAL FIXATION (ORIF) METATARSAL (TOE) FRACTURE; RIGHT 5TH  SURGEON:  Hridaan Bouse A Ileta Ofarrell, MD  PHYSICIAN ASSISTANT: None  ANESTHESIA:   General  PREOPERATIVE INDICATIONS:  Regina Lambert is a  71 y.o. female with a diagnosis of right fifth metatarsal Jones fracture.    The risks benefits and alternatives were discussed with the patient preoperatively including but not limited to the risks of infection, bleeding, nerve injury, cardiopulmonary complications, the need for revision surgery, among others, and the patient was willing to proceed.  ESTIMATED BLOOD LOSS: 5cc  OPERATIVE IMPLANTS:  Arthrex 6.0 x 50 mm solid partially-threaded screw Implant Name Type Inv. Item Serial No. Manufacturer Lot No. LRB No. Used Action  ARTHREX 6.0 X 50 JONES SCREW     STERILIZED IN TRAY Right 1 Implanted    OPERATIVE FINDINGS: Successful ORIF right fifth metatarsal Jones fracture.  Good purchase with the screw and compression at the fracture site.  OPERATIVE PROCEDURE:  A block was performed in the preop area patient was brought to the operating table.  Ancef was given.  Anesthesia was induced.  Thigh tourniquet was placed but not inflated.  The right lower extremity was prepped and draped in a sterile fashion.  A small 2 cm incision was made in line with the fifth metatarsal just proximal to the base.  We bluntly dissected down to the base and soft tissue protector was inserted.  A guidewire for the Arthrex Jones fracture set was placed intramedullary we confirmed a trajectory that was in the "high and inside" position on 3 views of the metatarsal itself.    Once were happy with our guidewire position it was advanced on power into the medullary canal and its placement was again confirmed on fluoroscopy.    A drill  was used to open the cortex.    The bone was than tapped and a 4.39m tap obtained through the 50 mm length.  Under fluoroscopy we felt we had appropriate length with arthritis beyond the fracture.  However there was not adequate purchase of the cortical bone in the medullary canal.  We tapped up to the 5.5 and then the 6.0 mm tap.  With a 6.0 mm tap we had good purchase with at the 50 mm length.  Guidewire was removed and we placed a solid 6.041mdiameter 5014mcrew and watched on fluoroscopy as it obtained purchase and compression at the fracture site.    After final fluoroscopic images were obtained we irrigated the incision and closed it in a multilayer fashion with 2-0 Vicryl and 3-0 nylon.  A short leg posterior slab splint was applied..  Patient awoken from anesthesia and taken to the pacu in stable condition.    Post op recs: WB: NWB RLE Abx: ancef given preop Dressing: keep splint intact until follow-up DVT prophylaxis: Aspirin 81 mg twice daily starting POD1 x4 weeks Follow up: 2 weeks after surgery for a wound check with Dr. [facility]achery Dakins [facility] [address] [id number] Phone: [phone]

## 2022-01-09 NOTE — Progress Notes (Signed)
Assisted Dr. Oren Bracket with right, popliteal, ultrasound guided block. Side rails up, monitors on throughout procedure. See vital signs in flow sheet. Tolerated Procedure well.

## 2022-01-09 NOTE — Anesthesia Procedure Notes (Signed)
Procedure Name: LMA Insertion Date/Time: 01/09/2022 8:36 AM Performed by: Signe Colt, CRNA Pre-anesthesia Checklist: Patient identified, Emergency Drugs available, Suction available and Patient being monitored Patient Re-evaluated:Patient Re-evaluated prior to induction Oxygen Delivery Method: Circle System Utilized Preoxygenation: Pre-oxygenation with 100% oxygen Induction Type: IV induction Ventilation: Mask ventilation without difficulty LMA: LMA inserted LMA Size: 4.0 Number of attempts: 1 Airway Equipment and Method: bite block Placement Confirmation: positive ETCO2 Tube secured with: Tape Dental Injury: Teeth and Oropharynx as per pre-operative assessment

## 2022-01-12 ENCOUNTER — Encounter (HOSPITAL_BASED_OUTPATIENT_CLINIC_OR_DEPARTMENT_OTHER): Payer: Self-pay | Admitting: Orthopedic Surgery

## 2022-01-12 ENCOUNTER — Ambulatory Visit: Payer: Medicaid Other | Admitting: Podiatry

## 2022-01-15 DIAGNOSIS — F209 Schizophrenia, unspecified: Secondary | ICD-10-CM | POA: Diagnosis not present

## 2022-01-19 DIAGNOSIS — H524 Presbyopia: Secondary | ICD-10-CM | POA: Diagnosis not present

## 2022-01-19 DIAGNOSIS — E119 Type 2 diabetes mellitus without complications: Secondary | ICD-10-CM | POA: Diagnosis not present

## 2022-01-20 DIAGNOSIS — S92351D Displaced fracture of fifth metatarsal bone, right foot, subsequent encounter for fracture with routine healing: Secondary | ICD-10-CM | POA: Diagnosis not present

## 2022-01-30 DIAGNOSIS — I10 Essential (primary) hypertension: Secondary | ICD-10-CM | POA: Diagnosis not present

## 2022-01-30 DIAGNOSIS — E1165 Type 2 diabetes mellitus with hyperglycemia: Secondary | ICD-10-CM | POA: Diagnosis not present

## 2022-01-30 DIAGNOSIS — F29 Unspecified psychosis not due to a substance or known physiological condition: Secondary | ICD-10-CM | POA: Diagnosis not present

## 2022-01-30 DIAGNOSIS — E785 Hyperlipidemia, unspecified: Secondary | ICD-10-CM | POA: Diagnosis not present

## 2022-02-05 DIAGNOSIS — F209 Schizophrenia, unspecified: Secondary | ICD-10-CM | POA: Diagnosis not present

## 2022-02-20 DIAGNOSIS — S92351D Displaced fracture of fifth metatarsal bone, right foot, subsequent encounter for fracture with routine healing: Secondary | ICD-10-CM | POA: Diagnosis not present

## 2022-02-26 DIAGNOSIS — F209 Schizophrenia, unspecified: Secondary | ICD-10-CM | POA: Diagnosis not present

## 2022-03-03 ENCOUNTER — Other Ambulatory Visit (HOSPITAL_COMMUNITY): Payer: Self-pay | Admitting: Internal Medicine

## 2022-03-04 ENCOUNTER — Other Ambulatory Visit (HOSPITAL_COMMUNITY): Payer: Self-pay | Admitting: Internal Medicine

## 2022-03-04 DIAGNOSIS — S92309A Fracture of unspecified metatarsal bone(s), unspecified foot, initial encounter for closed fracture: Secondary | ICD-10-CM

## 2022-03-05 DIAGNOSIS — I10 Essential (primary) hypertension: Secondary | ICD-10-CM | POA: Diagnosis not present

## 2022-03-05 DIAGNOSIS — E1165 Type 2 diabetes mellitus with hyperglycemia: Secondary | ICD-10-CM | POA: Diagnosis not present

## 2022-03-11 ENCOUNTER — Ambulatory Visit (HOSPITAL_COMMUNITY)
Admission: RE | Admit: 2022-03-11 | Discharge: 2022-03-11 | Disposition: A | Discharge status: Home / Self Care | Payer: Medicare HMO | Source: Ambulatory Visit | Attending: Internal Medicine | Admitting: Internal Medicine

## 2022-03-11 DIAGNOSIS — Z780 Asymptomatic menopausal state: Secondary | ICD-10-CM | POA: Insufficient documentation

## 2022-03-11 DIAGNOSIS — M8588 Other specified disorders of bone density and structure, other site: Secondary | ICD-10-CM | POA: Diagnosis not present

## 2022-03-11 DIAGNOSIS — X58XXXA Exposure to other specified factors, initial encounter: Secondary | ICD-10-CM | POA: Insufficient documentation

## 2022-03-11 DIAGNOSIS — Z13820 Encounter for screening for osteoporosis: Secondary | ICD-10-CM | POA: Insufficient documentation

## 2022-03-11 DIAGNOSIS — S92309A Fracture of unspecified metatarsal bone(s), unspecified foot, initial encounter for closed fracture: Secondary | ICD-10-CM

## 2022-03-11 DIAGNOSIS — M810 Age-related osteoporosis without current pathological fracture: Secondary | ICD-10-CM | POA: Diagnosis not present

## 2022-03-19 DIAGNOSIS — F209 Schizophrenia, unspecified: Secondary | ICD-10-CM | POA: Diagnosis not present

## 2022-04-05 DIAGNOSIS — E1165 Type 2 diabetes mellitus with hyperglycemia: Secondary | ICD-10-CM | POA: Diagnosis not present

## 2022-04-05 DIAGNOSIS — I10 Essential (primary) hypertension: Secondary | ICD-10-CM | POA: Diagnosis not present

## 2022-04-09 DIAGNOSIS — F209 Schizophrenia, unspecified: Secondary | ICD-10-CM | POA: Diagnosis not present

## 2022-04-30 DIAGNOSIS — F209 Schizophrenia, unspecified: Secondary | ICD-10-CM | POA: Diagnosis not present

## 2022-05-06 DIAGNOSIS — E1165 Type 2 diabetes mellitus with hyperglycemia: Secondary | ICD-10-CM | POA: Diagnosis not present

## 2022-05-06 DIAGNOSIS — I10 Essential (primary) hypertension: Secondary | ICD-10-CM | POA: Diagnosis not present

## 2022-05-21 DIAGNOSIS — F209 Schizophrenia, unspecified: Secondary | ICD-10-CM | POA: Diagnosis not present

## 2022-06-05 DIAGNOSIS — E1165 Type 2 diabetes mellitus with hyperglycemia: Secondary | ICD-10-CM | POA: Diagnosis not present

## 2022-06-05 DIAGNOSIS — I10 Essential (primary) hypertension: Secondary | ICD-10-CM | POA: Diagnosis not present

## 2022-06-19 ENCOUNTER — Other Ambulatory Visit (HOSPITAL_COMMUNITY): Payer: Self-pay | Admitting: Internal Medicine

## 2022-06-19 DIAGNOSIS — Z1231 Encounter for screening mammogram for malignant neoplasm of breast: Secondary | ICD-10-CM

## 2022-07-01 ENCOUNTER — Ambulatory Visit (HOSPITAL_COMMUNITY)
Admission: RE | Admit: 2022-07-01 | Discharge: 2022-07-01 | Disposition: A | Discharge status: Home / Self Care | Payer: Medicare HMO | Source: Ambulatory Visit | Attending: Internal Medicine | Admitting: Internal Medicine

## 2022-07-01 DIAGNOSIS — F209 Schizophrenia, unspecified: Secondary | ICD-10-CM | POA: Diagnosis not present

## 2022-07-01 DIAGNOSIS — Z1231 Encounter for screening mammogram for malignant neoplasm of breast: Secondary | ICD-10-CM | POA: Insufficient documentation

## 2022-07-06 ENCOUNTER — Ambulatory Visit (HOSPITAL_COMMUNITY): Payer: Medicaid Other

## 2022-07-06 DIAGNOSIS — I10 Essential (primary) hypertension: Secondary | ICD-10-CM | POA: Diagnosis not present

## 2022-07-06 DIAGNOSIS — E1165 Type 2 diabetes mellitus with hyperglycemia: Secondary | ICD-10-CM | POA: Diagnosis not present

## 2022-07-21 DIAGNOSIS — F209 Schizophrenia, unspecified: Secondary | ICD-10-CM | POA: Diagnosis not present

## 2022-07-23 DIAGNOSIS — F209 Schizophrenia, unspecified: Secondary | ICD-10-CM | POA: Diagnosis not present

## 2022-08-05 ENCOUNTER — Other Ambulatory Visit (HOSPITAL_COMMUNITY): Payer: Self-pay | Admitting: Internal Medicine

## 2022-08-05 DIAGNOSIS — Z0001 Encounter for general adult medical examination with abnormal findings: Secondary | ICD-10-CM | POA: Diagnosis not present

## 2022-08-05 DIAGNOSIS — Z23 Encounter for immunization: Secondary | ICD-10-CM | POA: Diagnosis not present

## 2022-08-05 DIAGNOSIS — F17200 Nicotine dependence, unspecified, uncomplicated: Secondary | ICD-10-CM | POA: Diagnosis not present

## 2022-08-05 DIAGNOSIS — E1165 Type 2 diabetes mellitus with hyperglycemia: Secondary | ICD-10-CM | POA: Diagnosis not present

## 2022-08-05 DIAGNOSIS — Z1231 Encounter for screening mammogram for malignant neoplasm of breast: Secondary | ICD-10-CM

## 2022-08-05 DIAGNOSIS — J410 Simple chronic bronchitis: Secondary | ICD-10-CM | POA: Diagnosis not present

## 2022-08-05 DIAGNOSIS — Z1389 Encounter for screening for other disorder: Secondary | ICD-10-CM | POA: Diagnosis not present

## 2022-08-05 DIAGNOSIS — F17210 Nicotine dependence, cigarettes, uncomplicated: Secondary | ICD-10-CM | POA: Diagnosis not present

## 2022-08-05 DIAGNOSIS — Z1331 Encounter for screening for depression: Secondary | ICD-10-CM | POA: Diagnosis not present

## 2022-08-05 DIAGNOSIS — F259 Schizoaffective disorder, unspecified: Secondary | ICD-10-CM | POA: Diagnosis not present

## 2022-08-13 DIAGNOSIS — F209 Schizophrenia, unspecified: Secondary | ICD-10-CM | POA: Diagnosis not present

## 2022-09-03 DIAGNOSIS — F209 Schizophrenia, unspecified: Secondary | ICD-10-CM | POA: Diagnosis not present

## 2022-09-04 DIAGNOSIS — E785 Hyperlipidemia, unspecified: Secondary | ICD-10-CM | POA: Diagnosis not present

## 2022-09-04 DIAGNOSIS — Z0000 Encounter for general adult medical examination without abnormal findings: Secondary | ICD-10-CM | POA: Diagnosis not present

## 2022-09-04 DIAGNOSIS — E1165 Type 2 diabetes mellitus with hyperglycemia: Secondary | ICD-10-CM | POA: Diagnosis not present

## 2022-09-05 DIAGNOSIS — I10 Essential (primary) hypertension: Secondary | ICD-10-CM | POA: Diagnosis not present

## 2022-09-05 DIAGNOSIS — E1165 Type 2 diabetes mellitus with hyperglycemia: Secondary | ICD-10-CM | POA: Diagnosis not present

## 2022-09-24 DIAGNOSIS — F209 Schizophrenia, unspecified: Secondary | ICD-10-CM | POA: Diagnosis not present

## 2022-10-06 DIAGNOSIS — I10 Essential (primary) hypertension: Secondary | ICD-10-CM | POA: Diagnosis not present

## 2022-10-06 DIAGNOSIS — E1165 Type 2 diabetes mellitus with hyperglycemia: Secondary | ICD-10-CM | POA: Diagnosis not present

## 2022-10-15 DIAGNOSIS — F209 Schizophrenia, unspecified: Secondary | ICD-10-CM | POA: Diagnosis not present

## 2022-11-04 DIAGNOSIS — I10 Essential (primary) hypertension: Secondary | ICD-10-CM | POA: Diagnosis not present

## 2022-11-04 DIAGNOSIS — E1165 Type 2 diabetes mellitus with hyperglycemia: Secondary | ICD-10-CM | POA: Diagnosis not present

## 2022-11-05 DIAGNOSIS — F209 Schizophrenia, unspecified: Secondary | ICD-10-CM | POA: Diagnosis not present

## 2022-11-26 DIAGNOSIS — F209 Schizophrenia, unspecified: Secondary | ICD-10-CM | POA: Diagnosis not present

## 2022-12-05 DIAGNOSIS — E1165 Type 2 diabetes mellitus with hyperglycemia: Secondary | ICD-10-CM | POA: Diagnosis not present

## 2022-12-05 DIAGNOSIS — I10 Essential (primary) hypertension: Secondary | ICD-10-CM | POA: Diagnosis not present

## 2022-12-16 DIAGNOSIS — F209 Schizophrenia, unspecified: Secondary | ICD-10-CM | POA: Diagnosis not present

## 2022-12-28 DIAGNOSIS — F209 Schizophrenia, unspecified: Secondary | ICD-10-CM | POA: Diagnosis not present

## 2023-01-04 DIAGNOSIS — I10 Essential (primary) hypertension: Secondary | ICD-10-CM | POA: Diagnosis not present

## 2023-01-04 DIAGNOSIS — E1165 Type 2 diabetes mellitus with hyperglycemia: Secondary | ICD-10-CM | POA: Diagnosis not present

## 2023-01-07 DIAGNOSIS — F209 Schizophrenia, unspecified: Secondary | ICD-10-CM | POA: Diagnosis not present

## 2023-01-11 DIAGNOSIS — F17200 Nicotine dependence, unspecified, uncomplicated: Secondary | ICD-10-CM | POA: Diagnosis not present

## 2023-01-11 DIAGNOSIS — E785 Hyperlipidemia, unspecified: Secondary | ICD-10-CM | POA: Diagnosis not present

## 2023-01-11 DIAGNOSIS — F259 Schizoaffective disorder, unspecified: Secondary | ICD-10-CM | POA: Diagnosis not present

## 2023-01-11 DIAGNOSIS — F17210 Nicotine dependence, cigarettes, uncomplicated: Secondary | ICD-10-CM | POA: Diagnosis not present

## 2023-01-11 DIAGNOSIS — I10 Essential (primary) hypertension: Secondary | ICD-10-CM | POA: Diagnosis not present

## 2023-01-11 DIAGNOSIS — E1165 Type 2 diabetes mellitus with hyperglycemia: Secondary | ICD-10-CM | POA: Diagnosis not present

## 2023-01-20 ENCOUNTER — Ambulatory Visit (INDEPENDENT_AMBULATORY_CARE_PROVIDER_SITE_OTHER): Payer: Medicare HMO | Admitting: Podiatry

## 2023-01-20 ENCOUNTER — Encounter: Payer: Self-pay | Admitting: Podiatry

## 2023-01-20 DIAGNOSIS — E119 Type 2 diabetes mellitus without complications: Secondary | ICD-10-CM

## 2023-01-20 DIAGNOSIS — M79675 Pain in left toe(s): Secondary | ICD-10-CM

## 2023-01-20 DIAGNOSIS — M79674 Pain in right toe(s): Secondary | ICD-10-CM

## 2023-01-20 DIAGNOSIS — B351 Tinea unguium: Secondary | ICD-10-CM

## 2023-01-20 NOTE — Progress Notes (Signed)
This patient returns to my office for at risk foot care.  This patient requires this care by a professional since this patient will be at risk due to having diabetes mellitus.  This patient is unable to cut nails herself since the patient cannot reach her nails.These nails are painful walking and wearing shoes.  This patient presents for at risk foot care today. ° °General Appearance  Alert, conversant and in no acute stress. ° °Vascular  Dorsalis pedis and posterior tibial  pulses are palpable  bilaterally.  Capillary return is within normal limits  bilaterally. Temperature is within normal limits  bilaterally. ° °Neurologic  Senn-Weinstein monofilament wire test within normal limits  bilaterally. Muscle power within normal limits bilaterally. ° °Nails Thick disfigured discolored nails with subungual debris  from hallux to fifth toes bilaterally. No evidence of bacterial infection or drainage bilaterally. ° °Orthopedic  No limitations of motion  feet .  No crepitus or effusions noted.  No bony pathology or digital deformities noted. ° °Skin  normotropic skin with no porokeratosis noted bilaterally.  No signs of infections or ulcers noted.    ° °Onychomycosis  Pain in right toes  Pain in left toes ° °Consent was obtained for treatment procedures.   Mechanical debridement of nails 1-5  bilaterally performed with a nail nipper.  Filed with dremel without incident.  ° ° °Return office visit    4 months                  Told patient to return for periodic foot care and evaluation due to potential at risk complications. ° ° °Augusto Deckman DPM   °

## 2023-01-27 DIAGNOSIS — F209 Schizophrenia, unspecified: Secondary | ICD-10-CM | POA: Diagnosis not present

## 2023-02-10 DIAGNOSIS — E1165 Type 2 diabetes mellitus with hyperglycemia: Secondary | ICD-10-CM | POA: Diagnosis not present

## 2023-02-10 DIAGNOSIS — I10 Essential (primary) hypertension: Secondary | ICD-10-CM | POA: Diagnosis not present

## 2023-02-18 DIAGNOSIS — F209 Schizophrenia, unspecified: Secondary | ICD-10-CM | POA: Diagnosis not present

## 2023-03-11 DIAGNOSIS — F209 Schizophrenia, unspecified: Secondary | ICD-10-CM | POA: Diagnosis not present

## 2023-03-13 DIAGNOSIS — I10 Essential (primary) hypertension: Secondary | ICD-10-CM | POA: Diagnosis not present

## 2023-03-13 DIAGNOSIS — E1165 Type 2 diabetes mellitus with hyperglycemia: Secondary | ICD-10-CM | POA: Diagnosis not present

## 2023-04-08 DIAGNOSIS — F209 Schizophrenia, unspecified: Secondary | ICD-10-CM | POA: Diagnosis not present

## 2023-04-13 DIAGNOSIS — I10 Essential (primary) hypertension: Secondary | ICD-10-CM | POA: Diagnosis not present

## 2023-04-13 DIAGNOSIS — E1165 Type 2 diabetes mellitus with hyperglycemia: Secondary | ICD-10-CM | POA: Diagnosis not present

## 2023-04-29 DIAGNOSIS — F209 Schizophrenia, unspecified: Secondary | ICD-10-CM | POA: Diagnosis not present

## 2023-05-13 DIAGNOSIS — I10 Essential (primary) hypertension: Secondary | ICD-10-CM | POA: Diagnosis not present

## 2023-05-13 DIAGNOSIS — E1165 Type 2 diabetes mellitus with hyperglycemia: Secondary | ICD-10-CM | POA: Diagnosis not present

## 2023-05-20 DIAGNOSIS — F209 Schizophrenia, unspecified: Secondary | ICD-10-CM | POA: Diagnosis not present

## 2023-05-24 ENCOUNTER — Encounter: Payer: Self-pay | Admitting: Podiatry

## 2023-05-24 ENCOUNTER — Ambulatory Visit (INDEPENDENT_AMBULATORY_CARE_PROVIDER_SITE_OTHER): Payer: Medicare HMO | Admitting: Podiatry

## 2023-05-24 DIAGNOSIS — E119 Type 2 diabetes mellitus without complications: Secondary | ICD-10-CM

## 2023-05-24 DIAGNOSIS — M79675 Pain in left toe(s): Secondary | ICD-10-CM | POA: Diagnosis not present

## 2023-05-24 DIAGNOSIS — B351 Tinea unguium: Secondary | ICD-10-CM | POA: Diagnosis not present

## 2023-05-24 DIAGNOSIS — M79674 Pain in right toe(s): Secondary | ICD-10-CM

## 2023-05-24 NOTE — Progress Notes (Signed)
This patient returns to my office for at risk foot care.  This patient requires this care by a professional since this patient will be at risk due to having diabetes mellitus.  This patient is unable to cut nails herself since the patient cannot reach her nails.These nails are painful walking and wearing shoes.  This patient presents for at risk foot care today.  General Appearance  Alert, conversant and in no acute stress.  Vascular  Dorsalis pedis and posterior tibial  pulses are palpable  bilaterally.  Capillary return is within normal limits  bilaterally. Temperature is within normal limits  bilaterally.  Neurologic  Senn-Weinstein monofilament wire test within normal limits  bilaterally. Muscle power within normal limits bilaterally.  Nails Thick disfigured discolored nails with subungual debris  from hallux to fifth toes bilaterally. No evidence of bacterial infection or drainage bilaterally.  Orthopedic  No limitations of motion  feet .  No crepitus or effusions noted.  No bony pathology or digital deformities noted.  Skin  normotropic skin with no porokeratosis noted bilaterally.  No signs of infections or ulcers noted.     Onychomycosis  Pain in right toes  Pain in left toes  Consent was obtained for treatment procedures.   Mechanical debridement of nails 1-5  bilaterally performed with a nail nipper.  Filed with dremel without incident.    Return office visit    4 months                  Told patient to return for periodic foot care and evaluation due to potential at risk complications.   Helane Gunther DPM

## 2023-06-07 DIAGNOSIS — H524 Presbyopia: Secondary | ICD-10-CM | POA: Diagnosis not present

## 2023-06-07 DIAGNOSIS — E119 Type 2 diabetes mellitus without complications: Secondary | ICD-10-CM | POA: Diagnosis not present

## 2023-06-10 DIAGNOSIS — F209 Schizophrenia, unspecified: Secondary | ICD-10-CM | POA: Diagnosis not present

## 2023-06-13 DIAGNOSIS — I10 Essential (primary) hypertension: Secondary | ICD-10-CM | POA: Diagnosis not present

## 2023-06-13 DIAGNOSIS — E1165 Type 2 diabetes mellitus with hyperglycemia: Secondary | ICD-10-CM | POA: Diagnosis not present

## 2023-06-14 DIAGNOSIS — F209 Schizophrenia, unspecified: Secondary | ICD-10-CM | POA: Diagnosis not present

## 2023-07-01 DIAGNOSIS — F209 Schizophrenia, unspecified: Secondary | ICD-10-CM | POA: Diagnosis not present

## 2023-07-13 DIAGNOSIS — E1165 Type 2 diabetes mellitus with hyperglycemia: Secondary | ICD-10-CM | POA: Diagnosis not present

## 2023-07-13 DIAGNOSIS — I10 Essential (primary) hypertension: Secondary | ICD-10-CM | POA: Diagnosis not present

## 2023-07-22 DIAGNOSIS — F209 Schizophrenia, unspecified: Secondary | ICD-10-CM | POA: Diagnosis not present

## 2023-08-12 DIAGNOSIS — F209 Schizophrenia, unspecified: Secondary | ICD-10-CM | POA: Diagnosis not present

## 2023-08-13 DIAGNOSIS — E1165 Type 2 diabetes mellitus with hyperglycemia: Secondary | ICD-10-CM | POA: Diagnosis not present

## 2023-08-13 DIAGNOSIS — I10 Essential (primary) hypertension: Secondary | ICD-10-CM | POA: Diagnosis not present

## 2023-09-02 DIAGNOSIS — F209 Schizophrenia, unspecified: Secondary | ICD-10-CM | POA: Diagnosis not present

## 2023-09-10 ENCOUNTER — Other Ambulatory Visit (HOSPITAL_COMMUNITY): Payer: Self-pay | Admitting: Internal Medicine

## 2023-09-10 ENCOUNTER — Other Ambulatory Visit (HOSPITAL_COMMUNITY): Payer: Self-pay | Admitting: Gerontology

## 2023-09-10 DIAGNOSIS — F259 Schizoaffective disorder, unspecified: Secondary | ICD-10-CM | POA: Diagnosis not present

## 2023-09-10 DIAGNOSIS — E1165 Type 2 diabetes mellitus with hyperglycemia: Secondary | ICD-10-CM | POA: Diagnosis not present

## 2023-09-10 DIAGNOSIS — J410 Simple chronic bronchitis: Secondary | ICD-10-CM | POA: Diagnosis not present

## 2023-09-10 DIAGNOSIS — Z1389 Encounter for screening for other disorder: Secondary | ICD-10-CM | POA: Diagnosis not present

## 2023-09-10 DIAGNOSIS — Z0001 Encounter for general adult medical examination with abnormal findings: Secondary | ICD-10-CM | POA: Diagnosis not present

## 2023-09-10 DIAGNOSIS — E785 Hyperlipidemia, unspecified: Secondary | ICD-10-CM | POA: Diagnosis not present

## 2023-09-10 DIAGNOSIS — Z1331 Encounter for screening for depression: Secondary | ICD-10-CM | POA: Diagnosis not present

## 2023-09-10 DIAGNOSIS — F17200 Nicotine dependence, unspecified, uncomplicated: Secondary | ICD-10-CM | POA: Diagnosis not present

## 2023-09-10 DIAGNOSIS — Z1231 Encounter for screening mammogram for malignant neoplasm of breast: Secondary | ICD-10-CM

## 2023-09-10 DIAGNOSIS — Z1159 Encounter for screening for other viral diseases: Secondary | ICD-10-CM | POA: Diagnosis not present

## 2023-09-10 DIAGNOSIS — I10 Essential (primary) hypertension: Secondary | ICD-10-CM | POA: Diagnosis not present

## 2023-09-10 DIAGNOSIS — Z780 Asymptomatic menopausal state: Secondary | ICD-10-CM

## 2023-09-10 DIAGNOSIS — F17210 Nicotine dependence, cigarettes, uncomplicated: Secondary | ICD-10-CM | POA: Diagnosis not present

## 2023-09-23 DIAGNOSIS — F209 Schizophrenia, unspecified: Secondary | ICD-10-CM | POA: Diagnosis not present

## 2023-09-24 ENCOUNTER — Ambulatory Visit: Payer: Medicare HMO | Admitting: Podiatry

## 2023-09-27 ENCOUNTER — Ambulatory Visit (INDEPENDENT_AMBULATORY_CARE_PROVIDER_SITE_OTHER): Payer: Medicare HMO | Admitting: Podiatry

## 2023-09-27 ENCOUNTER — Ambulatory Visit: Payer: Medicare HMO | Admitting: Podiatry

## 2023-09-27 ENCOUNTER — Encounter: Payer: Self-pay | Admitting: Podiatry

## 2023-09-27 DIAGNOSIS — M79674 Pain in right toe(s): Secondary | ICD-10-CM

## 2023-09-27 DIAGNOSIS — E119 Type 2 diabetes mellitus without complications: Secondary | ICD-10-CM

## 2023-09-27 DIAGNOSIS — B351 Tinea unguium: Secondary | ICD-10-CM | POA: Diagnosis not present

## 2023-09-27 DIAGNOSIS — M79675 Pain in left toe(s): Secondary | ICD-10-CM

## 2023-09-27 NOTE — Progress Notes (Signed)
This patient returns to my office for at risk foot care.  This patient requires this care by a professional since this patient will be at risk due to having diabetes mellitus.  This patient is unable to cut nails herself since the patient cannot reach her nails.These nails are painful walking and wearing shoes.  This patient presents for at risk foot care today.  General Appearance  Alert, conversant and in no acute stress.  Vascular  Dorsalis pedis and posterior tibial  pulses are palpable  bilaterally.  Capillary return is within normal limits  bilaterally. Temperature is within normal limits  bilaterally.  Neurologic  Senn-Weinstein monofilament wire test within normal limits  bilaterally. Muscle power within normal limits bilaterally.  Nails Thick disfigured discolored nails with subungual debris  from hallux to fifth toes bilaterally. No evidence of bacterial infection or drainage bilaterally.  Orthopedic  No limitations of motion  feet .  No crepitus or effusions noted.  No bony pathology or digital deformities noted.  Skin  normotropic skin with no porokeratosis noted bilaterally.  No signs of infections or ulcers noted.     Onychomycosis  Pain in right toes  Pain in left toes  Consent was obtained for treatment procedures.   Mechanical debridement of nails 1-5  bilaterally performed with a nail nipper.  Filed with dremel without incident.    Return office visit    4 months                  Told patient to return for periodic foot care and evaluation due to potential at risk complications.   Helane Gunther DPM

## 2023-10-01 ENCOUNTER — Ambulatory Visit (HOSPITAL_COMMUNITY): Payer: Medicare HMO

## 2023-10-01 ENCOUNTER — Other Ambulatory Visit (HOSPITAL_COMMUNITY): Payer: Medicare HMO

## 2023-10-07 DIAGNOSIS — I10 Essential (primary) hypertension: Secondary | ICD-10-CM | POA: Diagnosis not present

## 2023-10-07 DIAGNOSIS — E1165 Type 2 diabetes mellitus with hyperglycemia: Secondary | ICD-10-CM | POA: Diagnosis not present

## 2023-10-14 DIAGNOSIS — F209 Schizophrenia, unspecified: Secondary | ICD-10-CM | POA: Diagnosis not present

## 2023-11-01 DIAGNOSIS — F209 Schizophrenia, unspecified: Secondary | ICD-10-CM | POA: Diagnosis not present

## 2023-11-04 DIAGNOSIS — I10 Essential (primary) hypertension: Secondary | ICD-10-CM | POA: Diagnosis not present

## 2023-11-04 DIAGNOSIS — E1165 Type 2 diabetes mellitus with hyperglycemia: Secondary | ICD-10-CM | POA: Diagnosis not present

## 2023-11-04 DIAGNOSIS — F209 Schizophrenia, unspecified: Secondary | ICD-10-CM | POA: Diagnosis not present

## 2023-11-25 DIAGNOSIS — F209 Schizophrenia, unspecified: Secondary | ICD-10-CM | POA: Diagnosis not present

## 2023-12-07 DIAGNOSIS — I10 Essential (primary) hypertension: Secondary | ICD-10-CM | POA: Diagnosis not present

## 2023-12-07 DIAGNOSIS — E1165 Type 2 diabetes mellitus with hyperglycemia: Secondary | ICD-10-CM | POA: Diagnosis not present

## 2023-12-16 DIAGNOSIS — F209 Schizophrenia, unspecified: Secondary | ICD-10-CM | POA: Diagnosis not present

## 2024-01-06 DIAGNOSIS — I10 Essential (primary) hypertension: Secondary | ICD-10-CM | POA: Diagnosis not present

## 2024-01-06 DIAGNOSIS — E1165 Type 2 diabetes mellitus with hyperglycemia: Secondary | ICD-10-CM | POA: Diagnosis not present

## 2024-01-06 DIAGNOSIS — F209 Schizophrenia, unspecified: Secondary | ICD-10-CM | POA: Diagnosis not present

## 2024-01-25 ENCOUNTER — Ambulatory Visit (INDEPENDENT_AMBULATORY_CARE_PROVIDER_SITE_OTHER): Payer: Medicare HMO | Admitting: Podiatry

## 2024-01-25 ENCOUNTER — Encounter: Payer: Self-pay | Admitting: Podiatry

## 2024-01-25 DIAGNOSIS — M79675 Pain in left toe(s): Secondary | ICD-10-CM | POA: Diagnosis not present

## 2024-01-25 DIAGNOSIS — B351 Tinea unguium: Secondary | ICD-10-CM

## 2024-01-25 DIAGNOSIS — M79674 Pain in right toe(s): Secondary | ICD-10-CM

## 2024-01-25 DIAGNOSIS — E119 Type 2 diabetes mellitus without complications: Secondary | ICD-10-CM

## 2024-01-25 NOTE — Progress Notes (Signed)
This patient returns to my office for at risk foot care.  This patient requires this care by a professional since this patient will be at risk due to having diabetes mellitus.  This patient is unable to cut nails herself since the patient cannot reach her nails.These nails are painful walking and wearing shoes.  This patient presents for at risk foot care today.  General Appearance  Alert, conversant and in no acute stress.  Vascular  Dorsalis pedis and posterior tibial  pulses are palpable  bilaterally.  Capillary return is within normal limits  bilaterally. Temperature is within normal limits  bilaterally.  Neurologic  Senn-Weinstein monofilament wire test within normal limits  bilaterally. Muscle power within normal limits bilaterally.  Nails Thick disfigured discolored nails with subungual debris  from hallux to fifth toes bilaterally. No evidence of bacterial infection or drainage bilaterally.  Orthopedic  No limitations of motion  feet .  No crepitus or effusions noted.  No bony pathology or digital deformities noted.  Skin  normotropic skin with no porokeratosis noted bilaterally.  No signs of infections or ulcers noted.     Onychomycosis  Pain in right toes  Pain in left toes  Consent was obtained for treatment procedures.   Mechanical debridement of nails 1-5  bilaterally performed with a nail nipper.  Filed with dremel without incident.    Return office visit    4 months                  Told patient to return for periodic foot care and evaluation due to potential at risk complications.   Helane Gunther DPM

## 2024-01-27 DIAGNOSIS — F209 Schizophrenia, unspecified: Secondary | ICD-10-CM | POA: Diagnosis not present

## 2024-02-06 DIAGNOSIS — E1165 Type 2 diabetes mellitus with hyperglycemia: Secondary | ICD-10-CM | POA: Diagnosis not present

## 2024-02-06 DIAGNOSIS — I10 Essential (primary) hypertension: Secondary | ICD-10-CM | POA: Diagnosis not present

## 2024-02-14 DIAGNOSIS — F209 Schizophrenia, unspecified: Secondary | ICD-10-CM | POA: Diagnosis not present

## 2024-03-09 DIAGNOSIS — F209 Schizophrenia, unspecified: Secondary | ICD-10-CM | POA: Diagnosis not present

## 2024-03-09 DIAGNOSIS — I10 Essential (primary) hypertension: Secondary | ICD-10-CM | POA: Diagnosis not present

## 2024-03-09 DIAGNOSIS — F259 Schizoaffective disorder, unspecified: Secondary | ICD-10-CM | POA: Diagnosis not present

## 2024-03-09 DIAGNOSIS — J410 Simple chronic bronchitis: Secondary | ICD-10-CM | POA: Diagnosis not present

## 2024-03-09 DIAGNOSIS — E1142 Type 2 diabetes mellitus with diabetic polyneuropathy: Secondary | ICD-10-CM | POA: Diagnosis not present

## 2024-03-09 DIAGNOSIS — E1165 Type 2 diabetes mellitus with hyperglycemia: Secondary | ICD-10-CM | POA: Diagnosis not present

## 2024-03-14 DIAGNOSIS — E1165 Type 2 diabetes mellitus with hyperglycemia: Secondary | ICD-10-CM | POA: Diagnosis not present

## 2024-03-14 DIAGNOSIS — E785 Hyperlipidemia, unspecified: Secondary | ICD-10-CM | POA: Diagnosis not present

## 2024-03-30 DIAGNOSIS — F209 Schizophrenia, unspecified: Secondary | ICD-10-CM | POA: Diagnosis not present

## 2024-04-08 DIAGNOSIS — I10 Essential (primary) hypertension: Secondary | ICD-10-CM | POA: Diagnosis not present

## 2024-04-08 DIAGNOSIS — E1165 Type 2 diabetes mellitus with hyperglycemia: Secondary | ICD-10-CM | POA: Diagnosis not present

## 2024-04-11 DIAGNOSIS — F209 Schizophrenia, unspecified: Secondary | ICD-10-CM | POA: Diagnosis not present

## 2024-04-26 ENCOUNTER — Encounter: Payer: Self-pay | Admitting: Podiatry

## 2024-04-26 ENCOUNTER — Ambulatory Visit (INDEPENDENT_AMBULATORY_CARE_PROVIDER_SITE_OTHER): Admitting: Podiatry

## 2024-04-26 DIAGNOSIS — E119 Type 2 diabetes mellitus without complications: Secondary | ICD-10-CM

## 2024-04-26 DIAGNOSIS — M79675 Pain in left toe(s): Secondary | ICD-10-CM | POA: Diagnosis not present

## 2024-04-26 DIAGNOSIS — B351 Tinea unguium: Secondary | ICD-10-CM

## 2024-04-26 DIAGNOSIS — M79674 Pain in right toe(s): Secondary | ICD-10-CM | POA: Diagnosis not present

## 2024-04-26 NOTE — Progress Notes (Signed)
This patient returns to my office for at risk foot care.  This patient requires this care by a professional since this patient will be at risk due to having diabetes mellitus.  This patient is unable to cut nails herself since the patient cannot reach her nails.These nails are painful walking and wearing shoes.  This patient presents for at risk foot care today.  General Appearance  Alert, conversant and in no acute stress.  Vascular  Dorsalis pedis and posterior tibial  pulses are palpable  bilaterally.  Capillary return is within normal limits  bilaterally. Temperature is within normal limits  bilaterally.  Neurologic  Senn-Weinstein monofilament wire test within normal limits  bilaterally. Muscle power within normal limits bilaterally.  Nails Thick disfigured discolored nails with subungual debris  from hallux to fifth toes bilaterally. No evidence of bacterial infection or drainage bilaterally.  Orthopedic  No limitations of motion  feet .  No crepitus or effusions noted.  No bony pathology or digital deformities noted.  Skin  normotropic skin with no porokeratosis noted bilaterally.  No signs of infections or ulcers noted.     Onychomycosis  Pain in right toes  Pain in left toes  Consent was obtained for treatment procedures.   Mechanical debridement of nails 1-5  bilaterally performed with a nail nipper.  Filed with dremel without incident.    Return office visit   3 months                   Told patient to return for periodic foot care and evaluation due to potential at risk complications.   Alexxander Kurt DPM   

## 2024-04-27 DIAGNOSIS — F209 Schizophrenia, unspecified: Secondary | ICD-10-CM | POA: Diagnosis not present

## 2024-05-09 DIAGNOSIS — I10 Essential (primary) hypertension: Secondary | ICD-10-CM | POA: Diagnosis not present

## 2024-05-09 DIAGNOSIS — E1165 Type 2 diabetes mellitus with hyperglycemia: Secondary | ICD-10-CM | POA: Diagnosis not present

## 2024-05-18 DIAGNOSIS — F209 Schizophrenia, unspecified: Secondary | ICD-10-CM | POA: Diagnosis not present

## 2024-06-07 DIAGNOSIS — H524 Presbyopia: Secondary | ICD-10-CM | POA: Diagnosis not present

## 2024-06-07 DIAGNOSIS — E119 Type 2 diabetes mellitus without complications: Secondary | ICD-10-CM | POA: Diagnosis not present

## 2024-06-08 DIAGNOSIS — I10 Essential (primary) hypertension: Secondary | ICD-10-CM | POA: Diagnosis not present

## 2024-06-08 DIAGNOSIS — F209 Schizophrenia, unspecified: Secondary | ICD-10-CM | POA: Diagnosis not present

## 2024-06-08 DIAGNOSIS — E1165 Type 2 diabetes mellitus with hyperglycemia: Secondary | ICD-10-CM | POA: Diagnosis not present

## 2024-07-09 DIAGNOSIS — E1165 Type 2 diabetes mellitus with hyperglycemia: Secondary | ICD-10-CM | POA: Diagnosis not present

## 2024-07-09 DIAGNOSIS — I10 Essential (primary) hypertension: Secondary | ICD-10-CM | POA: Diagnosis not present

## 2024-07-26 ENCOUNTER — Encounter: Payer: Self-pay | Admitting: Podiatry

## 2024-07-26 ENCOUNTER — Ambulatory Visit: Admitting: Podiatry

## 2024-07-26 DIAGNOSIS — M79674 Pain in right toe(s): Secondary | ICD-10-CM | POA: Diagnosis not present

## 2024-07-26 DIAGNOSIS — B351 Tinea unguium: Secondary | ICD-10-CM | POA: Diagnosis not present

## 2024-07-26 DIAGNOSIS — E119 Type 2 diabetes mellitus without complications: Secondary | ICD-10-CM | POA: Diagnosis not present

## 2024-07-26 DIAGNOSIS — M79675 Pain in left toe(s): Secondary | ICD-10-CM

## 2024-07-26 NOTE — Progress Notes (Signed)
This patient returns to my office for at risk foot care.  This patient requires this care by a professional since this patient will be at risk due to having diabetes mellitus.  This patient is unable to cut nails herself since the patient cannot reach her nails.These nails are painful walking and wearing shoes.  This patient presents for at risk foot care today.  General Appearance  Alert, conversant and in no acute stress.  Vascular  Dorsalis pedis and posterior tibial  pulses are palpable  bilaterally.  Capillary return is within normal limits  bilaterally. Temperature is within normal limits  bilaterally.  Neurologic  Senn-Weinstein monofilament wire test within normal limits  bilaterally. Muscle power within normal limits bilaterally.  Nails Thick disfigured discolored nails with subungual debris  from hallux to fifth toes bilaterally. No evidence of bacterial infection or drainage bilaterally.  Orthopedic  No limitations of motion  feet .  No crepitus or effusions noted.  No bony pathology or digital deformities noted.  Skin  normotropic skin with no porokeratosis noted bilaterally.  No signs of infections or ulcers noted.     Onychomycosis  Pain in right toes  Pain in left toes  Consent was obtained for treatment procedures.   Mechanical debridement of nails 1-5  bilaterally performed with a nail nipper.  Filed with dremel without incident.    Return office visit   3 months                   Told patient to return for periodic foot care and evaluation due to potential at risk complications.   Alexxander Kurt DPM   

## 2024-09-04 ENCOUNTER — Other Ambulatory Visit (HOSPITAL_COMMUNITY): Payer: Self-pay | Admitting: Internal Medicine

## 2024-09-04 DIAGNOSIS — Z1231 Encounter for screening mammogram for malignant neoplasm of breast: Secondary | ICD-10-CM

## 2024-10-25 ENCOUNTER — Ambulatory Visit: Admitting: Podiatry

## 2025-05-16 ENCOUNTER — Ambulatory Visit (HOSPITAL_COMMUNITY)
